# Patient Record
Sex: Female | Born: 1965 | Race: Black or African American | Hispanic: No | Marital: Married | Smoking: Never smoker
Health system: Southern US, Community
[De-identification: ages and names within clinical notes are randomized; demographics above are authoritative.]

## PROBLEM LIST (undated history)

## (undated) DIAGNOSIS — K219 Gastro-esophageal reflux disease without esophagitis: Secondary | ICD-10-CM

## (undated) DIAGNOSIS — F32A Depression, unspecified: Secondary | ICD-10-CM

## (undated) DIAGNOSIS — Z8719 Personal history of other diseases of the digestive system: Secondary | ICD-10-CM

## (undated) DIAGNOSIS — D649 Anemia, unspecified: Secondary | ICD-10-CM

## (undated) DIAGNOSIS — D219 Benign neoplasm of connective and other soft tissue, unspecified: Secondary | ICD-10-CM

## (undated) DIAGNOSIS — M1990 Unspecified osteoarthritis, unspecified site: Secondary | ICD-10-CM

## (undated) DIAGNOSIS — E785 Hyperlipidemia, unspecified: Secondary | ICD-10-CM

## (undated) DIAGNOSIS — G4730 Sleep apnea, unspecified: Secondary | ICD-10-CM

## (undated) DIAGNOSIS — F419 Anxiety disorder, unspecified: Secondary | ICD-10-CM

## (undated) DIAGNOSIS — I10 Essential (primary) hypertension: Secondary | ICD-10-CM

## (undated) HISTORY — DX: Gastro-esophageal reflux disease without esophagitis: K21.9

## (undated) HISTORY — DX: Hyperlipidemia, unspecified: E78.5

## (undated) HISTORY — PX: CHOLECYSTECTOMY: SHX55

## (undated) HISTORY — DX: Benign neoplasm of connective and other soft tissue, unspecified: D21.9

---
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
## 2000-09-02 HISTORY — PX: MYOMECTOMY: SHX85

## 2000-09-02 HISTORY — PX: NISSEN FUNDOPLICATION: SHX2091

## 2001-05-19 ENCOUNTER — Encounter: Payer: Self-pay | Admitting: Gastroenterology

## 2001-05-19 ENCOUNTER — Ambulatory Visit (HOSPITAL_COMMUNITY): Admission: RE | Admit: 2001-05-19 | Discharge: 2001-05-19 | Payer: Self-pay | Admitting: Gastroenterology

## 2001-05-27 ENCOUNTER — Ambulatory Visit (HOSPITAL_COMMUNITY): Admission: RE | Admit: 2001-05-27 | Discharge: 2001-05-27 | Payer: Self-pay | Admitting: Gastroenterology

## 2001-07-24 ENCOUNTER — Encounter (INDEPENDENT_AMBULATORY_CARE_PROVIDER_SITE_OTHER): Payer: Self-pay | Admitting: Specialist

## 2001-07-24 ENCOUNTER — Inpatient Hospital Stay (HOSPITAL_COMMUNITY): Admission: RE | Admit: 2001-07-24 | Discharge: 2001-07-27 | Payer: Self-pay | Admitting: Surgery

## 2001-07-24 ENCOUNTER — Encounter: Payer: Self-pay | Admitting: Surgery

## 2002-03-15 ENCOUNTER — Ambulatory Visit (HOSPITAL_COMMUNITY): Admission: RE | Admit: 2002-03-15 | Discharge: 2002-03-15 | Payer: Self-pay | Admitting: Obstetrics and Gynecology

## 2002-03-15 ENCOUNTER — Encounter: Payer: Self-pay | Admitting: Obstetrics and Gynecology

## 2002-07-02 ENCOUNTER — Inpatient Hospital Stay (HOSPITAL_COMMUNITY): Admission: RE | Admit: 2002-07-02 | Discharge: 2002-07-04 | Payer: Self-pay | Admitting: Obstetrics and Gynecology

## 2002-07-02 ENCOUNTER — Encounter (INDEPENDENT_AMBULATORY_CARE_PROVIDER_SITE_OTHER): Payer: Self-pay | Admitting: Specialist

## 2003-12-20 ENCOUNTER — Ambulatory Visit (HOSPITAL_COMMUNITY): Admission: RE | Admit: 2003-12-20 | Discharge: 2003-12-20 | Payer: Self-pay | Admitting: Obstetrics and Gynecology

## 2005-03-19 ENCOUNTER — Other Ambulatory Visit: Admission: RE | Admit: 2005-03-19 | Discharge: 2005-03-19 | Payer: Self-pay | Admitting: Obstetrics and Gynecology

## 2005-06-27 ENCOUNTER — Encounter (INDEPENDENT_AMBULATORY_CARE_PROVIDER_SITE_OTHER): Payer: Self-pay | Admitting: *Deleted

## 2005-06-27 ENCOUNTER — Inpatient Hospital Stay (HOSPITAL_COMMUNITY): Admission: RE | Admit: 2005-06-27 | Discharge: 2005-07-01 | Payer: Self-pay | Admitting: Obstetrics and Gynecology

## 2005-09-02 HISTORY — PX: BREAST SURGERY: SHX581

## 2005-10-11 ENCOUNTER — Ambulatory Visit (HOSPITAL_BASED_OUTPATIENT_CLINIC_OR_DEPARTMENT_OTHER): Admission: RE | Admit: 2005-10-11 | Discharge: 2005-10-11 | Payer: Self-pay | Admitting: Surgery

## 2005-10-11 ENCOUNTER — Encounter (INDEPENDENT_AMBULATORY_CARE_PROVIDER_SITE_OTHER): Payer: Self-pay | Admitting: Specialist

## 2006-08-12 ENCOUNTER — Encounter (INDEPENDENT_AMBULATORY_CARE_PROVIDER_SITE_OTHER): Payer: Self-pay | Admitting: Specialist

## 2006-08-12 ENCOUNTER — Encounter: Admission: RE | Admit: 2006-08-12 | Discharge: 2006-08-12 | Payer: Self-pay | Admitting: Surgery

## 2007-08-14 ENCOUNTER — Encounter: Admission: RE | Admit: 2007-08-14 | Discharge: 2007-08-14 | Payer: Self-pay | Admitting: Obstetrics and Gynecology

## 2008-08-15 ENCOUNTER — Encounter: Admission: RE | Admit: 2008-08-15 | Discharge: 2008-08-15 | Payer: Self-pay | Admitting: Obstetrics and Gynecology

## 2008-08-19 ENCOUNTER — Ambulatory Visit: Payer: Self-pay | Admitting: Internal Medicine

## 2008-09-08 ENCOUNTER — Ambulatory Visit: Payer: Self-pay | Admitting: Internal Medicine

## 2008-09-08 HISTORY — PX: COLONOSCOPY: SHX174

## 2009-08-16 ENCOUNTER — Encounter: Admission: RE | Admit: 2009-08-16 | Discharge: 2009-08-16 | Payer: Self-pay | Admitting: Obstetrics and Gynecology

## 2010-08-21 ENCOUNTER — Encounter
Admission: RE | Admit: 2010-08-21 | Discharge: 2010-08-21 | Payer: Self-pay | Source: Home / Self Care | Attending: Obstetrics and Gynecology | Admitting: Obstetrics and Gynecology

## 2011-01-18 NOTE — Procedures (Signed)
[place]. [facility]  Patient:    [name], ROEPER Visit Number: [id number] MRN: [id number]          Service Type: END Location: ENDO Attending Physician:  Rich Brave Dictated by:   Florencia Reasons, M.D. Proc. Date: 05/27/01 Admit Date:  05/27/2001   CC:         Redmond Baseman, M.D.  [place] Daphine Deutscher, M.D.  Veverly Fells. Arletha Grippe, M.D.   Procedure Report  PROCEDURE:  Upper endoscopy.  ENDOSCOPIST:  Florencia Reasons, M.D.  INDICATION:  [age]-year-old African-American female with long-standing reflux symptoms, including probably some laryngopharyngeal reflux, whose symptoms have been fairly well-controlled by the use of Nexium, but who is interested in possibly having antireflux surgery so that she can get off medication.  FINDINGS:  Minimal spontaneously reducing hiatal hernia, otherwise, normal.  DESCRIPTION OF PROCEDURE:  The nature, purpose and risks of the procedure had been discussed with the patient, who provided written consent.  Sedation was Fentanyl 50 mcg and Versed 7.5 mg IV, without arrhythmias or desaturation. The Olympus standard adult videoendoscope was passed under direct vision.  The larynx and vocal cords looked normal at this time.  Specifically, I did not see any changes of overt reflux laryngitis, although an optimal controlled look was not obtained.  The esophagus was normal.  There was no evidence of free gastroesophageal reflux nor any reflux esophagitis, Barretts esophagus, varices, infection or neoplasia.  The Z-line was fairly well-defined but I did not see any ring or stricture.  There may have been some slight squamous irregularity right at the Z-line but really nothing I could call acute or chronic reflux-induced changes.  The stomach contained no significant residual and had normal mucosa without evidence of gastritis, erosions, ulcers, polyps or masses and the pylorus, duodenal bulb and  second duodenum looked normal.  Retroflexed viewing of the cardia of the stomach showed a 1- to 2-cm hiatal hernia, which really was not evident on antegrade viewing, suggesting that it was spontaneously reducing when the scope was withdrawn back into the esophagus.  No biopsies were obtained.  The patient tolerated the procedure well and there were no apparent complications.  IMPRESSION:  Minimal intermittent hiatal hernia, otherwise, normal exam.  No evidence of adverse sequela of gastroesophageal reflux disease.  PLAN:  Continue with plan for surgical evaluation.  Note that the patient had a normal upper GI series recently. Dictated by:   Florencia Reasons, M.D. Attending Physician:  Rich Brave DD:  05/27/01 TD:  05/27/01 Job: [id number]

## 2011-01-18 NOTE — H&P (Signed)
[name], [name]            ACCOUNT NO.:  [id number]   MEDICAL RECORD NO.:  [id number]          PATIENT TYPE:  INP   LOCATION:  NA                            FACILITY:  [facility]   PHYSICIAN:  Malachi Pro. Ambrose Mantle, M.D. DATE OF BIRTH:  1965-12-03   DATE OF ADMISSION:  06/27/2005  DATE OF DISCHARGE:                                HISTORY & PHYSICAL   HISTORY OF PRESENT ILLNESS:  A 45 year old, black, married female, para 0,  gravida 1, last menstrual period September 29, 2004, [name] June 17, 2005, by  ultrasound, admitted for C-section because at the time of a myomectomy the  incision either went into the endometrial cavity or came very close to it.  Blood group and type O positive.  Negative antibody.  Negative sickle cell.  Nonreactive serology.  Rubella immune.  Hepatitis B surface antigen  negative.  HIV negative.  GC and Chlamydia negative.  Pap smear normal.  One-  hour Glucola 184, three-hour GTT 75, 138, 114, and 133.  Group B strep was  positive.  The patient had a vaginal ultrasound on November 27, 2004, crown-  rump length was 1.88-cm, 8 weeks 3 days, [name] July 06, 2005.  Repeat  ultrasound, on February 05, 2005, average gestational age [redacted] weeks 4 days,  unchanged EDC.  The patient also had first trimester screening at [name] and at 13 weeks 0 days a due date was given of July 04, 2005.  The prenatal screening changed her risk of trisomy-18 and Down  syndrome to much less than would be accounted for by age alone.  The patient  was noted at 24 weeks to have a polyp or neoplasm at 9 o'clock on the cervix  and a Pap smear was done and it was negative.  A colposcopy was not done.  The patient has had an uncomplicated prenatal course.  Her only major  problem has been hemorrhoids that have been treated with Anusol-HC cream.  The patient is adamant that she wants tubal ligation at the time of the C-  section.   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   SURGERIES:  1.  The patient did have myomectomies in 2003, one of which was an      intramural fibroid that was larger than the uterus itself and either      came very close to the endometrial cavity or went into the endometrial      cavity.  2.  In 1971, she had a right inguinal hernia.  3.  In 1999, a repair of an esophageal hernia.  4.  She has also had a cholecystectomy.  5.  Hemorrhoidectomy.  6.  Tonsillectomy.   ILLNESSES:  The patient had esophageal problems.   There is no obstetric history.   FAMILY HISTORY:  Noncontributory except the nephew of the father of the baby  is mentally slow and the father of the baby has sickle cell trait.   PHYSICAL EXAMINATION:  GENERAL:  Well-developed, well-nourished, black  female in no distress.  VITAL SIGNS:  Blood pressure is 110/70,  pulse is 80, weight 184 pounds.  HEENT:  Normal.  HEART:  Normal sinus rhythm.  No murmurs.  LUNGS:  Clear to auscultation.  ABDOMEN:  Soft.  Fundal height 38-cm.  Fetal heart tones normal.  PELVIC:  Cervix:  Possible polyp or other type of growth at 9 o'clock on the  cervix.  The cervix is closed.  Presenting part is high.   ADMITTING IMPRESSION:  Intrauterine pregnancy at 39 weeks with a history of  myomectomy that was close to if not in the endometrial cavity.   The patient is admitted for C-section.  She wants to have her tubes tied.  She will be asked again prior to doing the procedure but if she wants it  done I will do it.      Malachi Pro. Ambrose Mantle, M.D.  Electronically Signed     TFH/MEDQ  D:  06/26/2005  T:  06/26/2005  Job:  [id number]

## 2011-01-18 NOTE — Procedures (Signed)
[place]. [facility]  Patient:    [name], [name] Visit Number: [id number] MRN: [id number]          Service Type: Attending:  Florencia Reasons, M.D. Dictated by:   Florencia Reasons, M.D. Proc. Date: 05/19/01   CC:         [place]. Daphine Deutscher, M.D.   Procedure Report  PROCEDURE PERFORMED:  Esophageal manometry.  ENDOSCOPIST:  Florencia Reasons, M.D.  INDICATIONS FOR PROCEDURE:  Consideration for antireflux surgery, for preoperative motility assessment.  FINDINGS:  Normal exam.  DESCRIPTION OF PROCEDURE:  The procedure had been discussed with the patient and she provided written consent.  It was done as an outpatient by the endoscopy nurse at [facility].  The solid state catheter was passed transnasally via the left nostril down to the stomach and sequential pullback was then performed.  FINDINGS: 1 - Lower esophageal sphincter.  The lower esophageal sphincter had slightly elevated resting tone of 61 mmHg (normal up to 45) but it relaxed normally with swallows.  2 - Esophageal body.  Esophageal contractions were normal in amplitude and duration and no spontaneous aperistaltic or repetitive activity was noted (in one channel, the amplitude was minimally elevated above the upper limit of normal but this is not felt to be clinically significant).  3 - Upper esophageal sphincter.  The upper esophageal sphincter had normal resting tone and appeared to relax appropriately with pharyngeal contractions.  IMPRESSION:  Essentially normal esophageal manometric study.  Mild nonspecific and insignificant abnormality such as increased resting tone of the lower esophageal sphincter, not felt to be clinically pertinent.  PLAN:  The patient, from the manometric perspective, is a candidate for antireflux surgery, if there is a reasonable degree of certainty that acid reflux is occurring.  If it is, it is probably occurring by  intermittent inappropriate relaxations of the lower esophageal sphincter muscle, since it has normal elevated resting tone. Dictated by:   Florencia Reasons, M.D. Attending:  Florencia Reasons, M.D. DD:  05/20/01 TD:  05/21/01 Job: [id number]

## 2011-01-18 NOTE — Op Note (Signed)
NAMEMarland Kitchen  DANICKA, HOURIHAN                      ACCOUNT NO.:  [id number]   MEDICAL RECORD NO.:  [id number]                   PATIENT TYPE:  INP   LOCATION:  X001                                 FACILITY:  [facility]   PHYSICIAN:  Malachi Pro. Ambrose Mantle, M.D.              DATE OF BIRTH:  1966-07-10   DATE OF PROCEDURE:  07/02/2002  DATE OF DISCHARGE:                                 OPERATIVE REPORT   PREOPERATIVE DIAGNOSES:  1. Leiomyomata uteri.  2. Menorrhagia.  3. Dysmenorrhea.  4. Anemia.   POSTOPERATIVE DIAGNOSES:  1. Leiomyomata uteri.  2. Menorrhagia.  3. Dysmenorrhea.  4. Anemia.   OPERATION:  Removal of large posterior fundal fibroid as well as two  probable fibroids, one located at the juncture of the left round ligament  and uterus and one close to the cornual portion of the fallopian tube on the  uterus. Myomectomies as stated.   SURGEON:  Malachi Pro. Ambrose Mantle, M.D.   ASSISTANT:  Zenaida Niece, M.D.   ANESTHESIA:  General.   DESCRIPTION OF PROCEDURE:  The patient was brought to the operating room and  placed under satisfactory general anesthesia. She was placed in frog leg  position. The abdomen, vulva, vagina and urethra were prepped with Betadine  solution and a Foley catheter was inserted to straight drain. Exam revealed  the uterus to be enlarged with a firm mass in the uterus. The adnexa were  free of masses. The patient was then placed supine, the abdomen was draped  as a sterile field. She did have what appeared to be a right inguinal hernia  scar. A transverse incision was made and carried in layers through the skin,  subcutaneous tissue and fascia. The fascia was separated from the rectus  muscle superiorly and inferiorly. The rectus muscle was then split in the  midline and the peritoneum was opened vertically. I could not examine the  upper abdomen because of the relatively small incision. The lower end of the  Balfour retractor could not be inserted because  of the fairly narrow  incision. I used a pediatric Balfour retractor and elevated the uterus into  the operative field. There was some clear fluid in the abdomen. Inspection  of the pelvis revealed the cul-de-sac to be clear, the uterus was anterior,  two times normal size with what appeared to be a posterior intramural  fibroid that on ultrasound had pushed the superior part of the endometrial  cavity anteriorly. There were also two smaller fibroids, one at the junction  of the left round ligament and the uterus and one just posterior to the  proximal most portion of the left fallopian tube. The anterior cul-de-sac  was normal. Both tubes and ovaries appeared normal although both ovaries had  a very smooth surface. The left ovary was larger than the right, the left  ovary probably measured 3 1/2 x 2 1/2 x 2 1/2 cm, the right  ovary measured  more like the usual 3 x 2 x 2. It could be that the patient has polycystic  ovarian syndrome. I injected the surface of the posterior aspect of the  uterus with a dilute solution of Pitressin. The Pitressin was 10 units and  100 cc and I used about 7 or 8 cc. Going by the ultrasound findings, the  uterine fibroid was in the posterior aspect of the uterus pushing the cavity  anteriorly. I then incised over the mass, got down to the fibroid itself,  grasped with a towel clip and then gradually dissected it free from all its  attachments and removed the fibroid. The fibroid was quite large. By  ultrasound it had measured 6.6 x 5 x 4.8 cm and that seemed to be about the  size that it was. It was probably larger than the uterus itself. The bed of  the fibroid appeared to be possibly consistent with the endometrium but I do  not think we entered the endometrial cavity. I could not follow any space  down toward the cervix. The uterine incision was then closed with two  running sutures of #0 Vicryl and this caused a good reapproximation of the  serosal surface  of the uterus and there was no significant bleeding. I then  removed both of the previously mentioned fibroids and without any suture  controlled bleeding with the Bovie. I liberally irrigated the pelvis, the  uterus and the incision sites, saw no bleeding and closed the abdominal wall  with running suture of #0 Vicryl on the peritoneum, interrupted #0 Vicryl on  the rectus muscle, two running sutures of #0 Vicryl on the fascia, a running  3-0 Vicryl in the subcu tissue and staples on the skin. The patient seemed  to tolerate the procedure well, blood loss was probably about 50 cc, sponge  and needle counts were correct and she was returned to recovery in  satisfactory condition.                                               Malachi Pro. Ambrose Mantle, M.D.    TFH/MEDQ  D:  07/02/2002  T:  07/02/2002  Job:  [id number]

## 2011-01-18 NOTE — H&P (Signed)
NAMELAMANDA, RUDDER                        ACCOUNT NO.:  [id number]   MEDICAL RECORD NO.:  [id number]                   PATIENT TYPE:   LOCATION:                                       FACILITY:   PHYSICIAN:  Malachi Pro. Ambrose Mantle, M.D.              DATE OF BIRTH:   DATE OF ADMISSION:  DATE OF DISCHARGE:                                HISTORY & PHYSICAL   HISTORY OF PRESENT ILLNESS:  The patient is a 45 year old black married  female, para 0, who was admitted to [facility] for  myomectomy because of fibroids, menorrhagia, and dysmenorrhea. This patient  had been on Ortho Tri-Cyclic and her pills were changed to Nordette. When I  saw her on February 26, 2002 she reported that over the last 3-4 months, her  periods had become heavier, longer, and much more painful. The uterus felt  somewhat irregular and quite firm. An ultrasound was done which was  interpreted as showing a 5x4.8x6.6 cm fibroid that displaced the endometrial  stripe anteriorly along its bundle course. The fibroid appeared to be  submucosal, subserosal and myometrial in location. The ovaries appeared  normal. The patient was advised to consider surgery and she agreed. She is  now admitted for attempted myomectomy. She understands that it is possible  that a hysterectomy would be needed. Her last menstrual period was June 05, 2002.   ALLERGIES:  MORPHINE.   PAST SURGICAL HISTORY:  Hiatal hernia repair, gallbladder surgery, and  hemorrhoidectomy. She has also had her tonsils removed.   PAST MEDICAL HISTORY:  Usual childhood diseases. She has no known heart  problems.   SOCIAL HISTORY:  She rarely drinks alcohol and does not smoke.   REVIEW OF SYSTEMS:  Negative.   FAMILY HISTORY:  Her mother is 62 years of age with bronchitis. Father is 54  years of age with high blood pressure. No sisters. One brother is living and  well.   PHYSICAL EXAMINATION:  GENERAL: A well developed, well nourished   black  female in no acute distress.  VITAL SIGNS: Weight 144 pounds. Blood pressure 130/70. Pulse 70.  HEENT: No cranial abnormalities. Extraocular muscles intact. Nose and  pharynx clear.  NECK: Supple. No thyromegaly.  HEART: Normal size and sounds. No murmurs.  LUNGS: Clear to auscultation and percussion.  BREASTS: Soft without masses.  ABDOMEN: Soft. No masses or tenderness. There are gallbladder surgery scars.  GU: Vulva and vagina clean. Cervix clean. The uterus is anterior. Upper  limits of normal size. Irregular in contour. The adnexa is free of masses.   IMPRESSION:  Recent onset menorrhagia and dysmenorrhea. Leiomyomata by  ultrasound.    PLAN:  The patient is admitted for myomectomy. She understands that there  are risks involved including pulmonary embolus, wound disruption,  hemorrhage, the need for re-operation and/or transfusion. She understands  that a hysterectomy could possibly have to be done. She is  ready to proceed.                                               Malachi Pro. Ambrose Mantle, M.D.    TFH/MEDQ  D:  07/01/2002  T:  07/01/2002  Job:  [id number]

## 2011-01-18 NOTE — Discharge Summary (Signed)
   [name], [name]                      ACCOUNT NO.:  [id number]   MEDICAL RECORD NO.:  [id number]                   PATIENT TYPE:  INP   LOCATION:  0477                                 FACILITY:  [facility]   PHYSICIAN:  Zenaida Niece, M.D.             DATE OF BIRTH:  1966-06-26   DATE OF ADMISSION:  07/02/2002  DATE OF DISCHARGE:  07/04/2002                                 DISCHARGE SUMMARY   ADMISSION DIAGNOSES:  1. Menorrhagia.  2. Dysmenorrhea.  3. Leiomyomatous uterus.   DISCHARGE DIAGNOSES:  1. Menorrhagia.  2. Dysmenorrhea.  3. Leiomyomatous uterus.   PROCEDURE:  Abdominal myomectomy on 07/02/02.   HISTORY OF PRESENT ILLNESS:  Please see the chart for the full history and  physical.  Briefly, this is a 45 year old black female, para 0, with recent  onset of menorrhagia and dysmenorrhea and found to have a 5 x 5 x 6 cm  fibroid felt to be the cause for the pain.  Dr. Ambrose Mantle discussed options,  and she wished to proceed with myomectomy.   PAST SURGICAL HISTORY:  1. Hiatal hernia repair.  2. Cholecystectomy.  3. Hemorrhoidectomy.  4. Tonsillectomy.   ALLERGIES:  MORPHINE.   PHYSICAL EXAMINATION:  Significant for an uterus which is anterior, upper  limits of normal size, and slightly irregular with no adnexal masses.   HOSPITAL COURSE:  The patient was admitted on 07/02/02, and underwent an  abdominal myomectomy under general anesthesia.  She had a large posterior  fundal fibroid and two tiny fibroids.  Estimated blood loss was 50 cc.  Postoperatively, the patient was able to rapidly ambulate and tolerate a  regular diet.  Preoperative hemoglobin was 10.6, postoperative hemoglobin  was 9.8.  This then stabilized at 9.6.  On the morning of postoperative day  #2, her incision was healing well, and she was felt to be stable enough for  discharge home.   DIET:  Regular.   ACTIVITY:  No strenuous activity.  Pelvic rest.    FOLLOWUP:  In 10 to 14 days for  an incision check.   DISCHARGE MEDICATIONS:  Percocet p.r.n. pain.                                                Zenaida Niece, M.D.    TDM/MEDQ  D:  07/13/2002  T:  07/13/2002  Job:  [id number]

## 2011-01-18 NOTE — Op Note (Signed)
[name], [name]            ACCOUNT NO.:  [id number]   MEDICAL RECORD NO.:  [id number]          PATIENT TYPE:  INP   LOCATION:  NA                            FACILITY:  [facility]   PHYSICIAN:  Malachi Pro. Ambrose Mantle, M.D. DATE OF BIRTH:  07-Jan-1966   DATE OF PROCEDURE:  06/27/2005  DATE OF DISCHARGE:                                 OPERATIVE REPORT   PREOPERATIVE DIAGNOSES:  1.  Intrauterine pregnancy at 39 weeks.  2.  Prior myomectomy with possible entry into the endometrial cavity.  3.  Sterilization.   POSTOPERATIVE DIAGNOSES:  1.  Intrauterine pregnancy at 39 weeks.  2.  Prior myomectomy with possible entry into the endometrial cavity.  3.  Sterilization.  4.  Omental adhesion to the left upper posterior uterine fundus.   OPERATION:  1.  Low transverse cervical C-section.  2.  Bilateral tubal ligation.  3.  Lysis of adhesions.   OPERATOR:  Dr. Ambrose Mantle   ASSISTANT:  Dr. Jackelyn Knife   ANESTHESIA:  Spinal.   The patient was given a spinal anesthetic by Dr. Harvest Forest, and she was placed  in left lateral tilt position.  The abdomen and urethra were prepped with  Betadine solution.  The Foley catheter was inserted to straight drain.  The  patient was placed supine, the abdomen was draped as a sterile field.  The  old transverse incision was utilized, and an incision was made through that  incision and carried in layers through the skin, subcutaneous tissue, and  fascia.  The fascia was separated from the rectus muscles superiorly and  inferiorly.  The rectus muscle was cut in the midline.  The peritoneum was  opened vertically.  Exploration of the lower uterine segment revealed that  the head was down in the lower uterine segment.  An incision was made in the  lower uterine segment peritoneum and extended transversely, and then this  was pushed inferiorly.  The bladder was retracted away.  An incision was  then made into the myometrium and into the endometrial cavity with my  finger.   I separated the uterine muscles superiorly and inferiorly in a  transverse direction by pulling superiorly and inferiorly.  Clear fluid was  obtained.  There was one loop of nuchal cord.  The infant was delivered  without difficulty.  The cord was clamped. The infant was given to Dr. Rennis Golden, who was in attendance.  It was a 6 pound 14 ounce female with Apgars  of 9 at one and 9 at five minutes.  A segment of cord was preserved in case  a pH was necessary.  Routine cord blood studies were obtained, and then the  placenta was removed.  The inside of the uterus was inspected and found to  be free of debris.  The cervix was dilated with a ring forceps.  Inside of  the uterus did not bleed a great deal nor did the uterine incision, but the  majority of the bleeding came from where I had tried to advance the lower  uterine segment peritoneum.  I finally was able to control this  with several  sutures of 0 Vicryl.  I closed the uterus in 2 layers using running locked  suture of 0 Vicryl in the first layer, nonlocking suture of the same  material on the second layer, and I did reapproximate the peritoneum with a  running suture of 0 Vicryl.  Hemostasis was adequate.  I identified both  tubes and ovaries.  Both ovaries looked normal.  The tubes looked normal.  I  made a window in the mesosalpinx on both the right and the left side, put 2  ties of 0 plain catgut proximally and distally on each tube and excised the  portion of tube in between.  There was no bleeding.  Reinspection of the  uterine incision revealed good hemostasis.  Prior to closing the uterus, I  had identified an omental adhesion to the left upper posterior fundus, and  this was divided between clamps, and the clamps were ligated with 0 Vicryl  ties.  Other than that adhesion, there was no sign that the patient had had  a prior myomectomy.  Two small fibroids, no more than 1 cm in diameter were  recognized on the uterine body.   Hemostasis was adequate.  The peritoneum  and uterine muscle were closed with interrupted sutures of 0 Vicryl, some  including the peritoneum, some of them just the rectus muscle.  The fascia  was closed with 2 running sutures of 0 Vicryl, the subcu tissue with a  running 3-0 Vicryl, and the skin was closed with automatic staples.  The  patient seemed to tolerate the procedure well.  Blood loss was estimated at  1000 mL.  Sponge and needle counts were correct, and she was returned to  recovery in satisfactory condition.      Malachi Pro. Ambrose Mantle, M.D.  Electronically Signed     TFH/MEDQ  D:  06/27/2005  T:  06/27/2005  Job:  [id number]

## 2011-01-18 NOTE — Discharge Summary (Signed)
[name], [name]            ACCOUNT NO.:  [id number]   MEDICAL RECORD NO.:  [id number]          PATIENT TYPE:  INP   LOCATION:  9106                          FACILITY:  [facility]   PHYSICIAN:  Malachi Pro. Ambrose Mantle, M.D. DATE OF BIRTH:  [birth year]   DATE OF ADMISSION:  06/27/2005  DATE OF DISCHARGE:  07/01/2005                                 DISCHARGE SUMMARY   A 45 year old black married female para 0, gravida 1 with [facility] July 06, 2005 by ultrasound admitted for cesarean section because at the time of a  myomectomy the incision either went into the endometrial cavity or came very  close to it.  Blood group and type O+.  Negative antibody.  Negative sickle  cell.  Nonreactive serology.  Rubella immune.  Hepatitis B surface antigen  negative.  HIV negative.  GC and Chlamydia negative.  Pap smear normal.  One-  hour Glucola 184.  Three-hour GTT 75, 138, 114, and 133.  Group B Strep  positive.  The patient was admitted, underwent a low transverse cervical  cesarean section by Dr. Ambrose Mantle.  The only evidence of the prior myomectomy  was an omental adhesion to the left upper posterior fundus.  These adhesions  were lysed.  The cesarean section was done and a tubal ligation was  performed at the patient's and her husband's strong request.  There were two  small fibroids noted neither of which was more than 1 cm in diameter.  Postoperatively the patient did quite well.  She was offered discharge  earlier, but chose to stay until the fourth postoperative day.  She was  voiding well without difficulty, passing flatus, tolerating a regular diet,  ambulating without difficulty.  Staples were removed, strips were applied.  The incision seemed to be healing well.  Initial hemoglobin 12.1, hematocrit  36.6, white count 8500, platelet count 333,000.  Follow-up hemoglobin 10.  Comprehensive metabolic profile was normal except for a glucose of 132,  alkaline phosphatase of 184, and albumin of 2.8.  RPR  was nonreactive.  Urinalysis showed 15 mg percent protein, otherwise was negative.   FINAL DIAGNOSES:  1.  Intrauterine pregnancy at 39 weeks delivered by cesarean section.  2.  History of myomectomy where the incision was either close to or in the      endometrial cavity.  3.  Leiomyomata uteri.  4.  Voluntary sterilization.   OPERATION:  1.  Low transverse cervical cesarean section.  2.  Bilateral tubal ligation.  3.  Lysis of adhesions.   FINAL CONDITION:  Improved.   INSTRUCTIONS:  Regular discharge instruction booklet.  The patient is  advised to return to the office in 10-14 days for follow-up examination.  Percocet 5/325 24 tablets one every four to six hours as needed for pain and  Motrin 600 mg 24 tablets one every six hours as needed for pain are given at  discharge.  24 of each are given, one refill on the ibuprofen.      Malachi Pro. Ambrose Mantle, M.D.  Electronically Signed     TFH/MEDQ  D:  07/01/2005  T:  07/01/2005  Job:  [id number]

## 2011-01-18 NOTE — Op Note (Signed)
[facility]  Patient:    [name], [name] [facility] Visit Number: [id number] MRN: [id number]          Service Type: SUR Location: 3W 0340 01 Attending Physician:  Katha Cabal Dictated by:   Thornton Park Daphine Deutscher, M.D. Proc. Date: 07/24/01 Admit Date:  07/24/2001   CC:         Redmond Baseman, M.D. at [facility]  Florencia Reasons, M.D.   Operative Report  PREOPERATIVE DIAGNOSES: 1. Gastroesophageal reflux disease. 2. Gallstones. 3. Thrombosed hemorrhoid.  POSTOPERATIVE DIAGNOSES: 1. Gastroesophageal reflux disease. 2. Gallstones. 3. Thrombosed hemorrhoid.  PROCEDURE: 1. Laparoscopic Nissen fundoplication over 56 dilator with two suture    closure of the diaphragm. 2. Laparoscopic cholecystectomy with intraoperative cholangiogram. 3. Excision of thrombosed hemorrhoids.  SURGEON:  Thornton Park. Daphine Deutscher, M.D.  ASSISTANT:  Adolph Pollack, M.D.  DESCRIPTION OF PROCEDURE:  [name] is a 45 year old lady who was taken to room #1 on Friday, July 24, 2001 and given general anesthesia. The abdomen was prepped with Betadine and draped sterilely. At a longitudinal incision above the umbilicus and through a pursestring suture, I inserted a Hasson cannula without difficulty. A 5 mm was placed in the upper midline, a 10/11 in the left upper quadrant and two 10/11s on the right side.  First, we worked on the [employer]. I used a harmonica scalpel to open to open up the gastrohepatic window and dissect the right crus. I carried this anteriorly over the distal esophagus and down somewhat onto the left side. I went around and created a retroesophageal window fairly easily to identify both crura.  Next, we began taking down short gastrics. This proved to be a challenge because the top part of her stomach was intimately attached and infused to her spleen. However, we worked diligently and carefully with the  harmonic scalpel and divided all the short gastrics, and did not encounter any bleeding. I wa able to tease this away and free up the top of the cardia.  Next, we grasped the cardia and elevated it from behind and I took down the final attachments of the upper portion of the stomach so that this area was completely mobilized.  A single suture was placed to approximate the crura down. I then reached around and grasped a portion of the stomach and found contiguous portion on both sides by doing a shoeshine maneuver. We then passed 56 lighted bougie and I then proceeded to place a three suture wrap using the Endostitch. All three sutures took bites on the esophagus and these were tied down. The upper two had clips placed on the knots. The bougie was withdrawn. I then tacked one other suture to close the hiatus a little bit more. This was approximated.  Next, we irrigated and no bleeding was seen. We withdrew the 5 mm port liver retractor which we were using and then concentrated on the gallbladder. The gallbladder was grasped and elevated. She had numerous thin filmy adhesions attached all along the fundus and down along the neck. We then dissected free the neck and cystic duct and the cystic artery. I used the harmonic scalpel to assist in this dissection. I put a clip upon the gallbladder and made an incision in the cystic duct, and after multiple angle attempts, I was able to get the Reddick catheter in and a dynamic cholangiogram showed a long cystic duct and good retrograde filling up into the liver with free flow into the  duodenum. The cystic duct was then triple clipped, divided. The cystic artery was divided and the gallbladder was removed without entering it. Once removed, we inspected the gallbladder bed and no bleeding or bile leaks were seen. The gallbladder was brought out through the umbilical port which was then tied down. I then placed a second suture of 0 Vicryl to complete  that closure.  All the ports were injected with 0.5% Marcaine and the abdomen was deflated. The wounds were closed with 4-0 Vicryl with Benzoin and Steri-Strips.  The patient was then placed up in stirrups and a thrombosed hemorrhoid, for which she had seen the people at [facility] last weekend, was identified. It was about the size of a quarter and was hard. This area was prepped with Betadine. I injected it with Marcaine. I excised an ellipse of skin and then removed the underlying thrombosed clots. This will enable this area to return into the rectum. I then massaged some Betadine area up into the area.  The patient was awakened and taken to the recovery room in satisfactory condition. Dictated by:   Thornton Park Daphine Deutscher, M.D. Attending Physician:  Katha Cabal DD:  07/24/01 TD:  07/25/01 Job: [id number]

## 2011-01-18 NOTE — Discharge Summary (Signed)
[facility]  Patient:    [name], [name] [facility] Visit Number: [id number] MRN: [id number]          Service Type: SUR Location: 3W 0340 01 Attending Physician:  Katha Cabal Dictated by:   Thornton Park Daphine Deutscher, M.D. Admit Date:  07/24/2001 Discharge Date: 07/27/2001   CC:         Florencia Reasons, M.D.  Redmond Baseman, M.D.   Discharge Summary  PROCEDURE:  November 22, laparoscopic Nissen over a #56 dilator with crural closure, laparoscopic cholecystectomy, intraoperative cholangiogram, excision of thrombosed hemorrhoid.  HOSPITAL COURSE:  [name] is a 45 year old lady with gastroesophageal reflux disease and gallstones.  On the day of her surgery, she acknowledged that she had been having problems with painful defecation and had a thrombosed hemorrhoid apparent in the holding area.  Therefore, we included in our operative permit permission for excision of the hemorrhoid.  All three were done.  She did well and was ready for discharge on July 27, 2001. Instructions were given, reviewed, and she is followed in the office.  FINAL DIAGNOSIS:  Gastroesophageal reflux disease status post laparoscopic Nissen, chronic cholecystitis and cholesterolosis, and hemorrhoid. Dictated by:   Thornton Park Daphine Deutscher, M.D. Attending Physician:  Katha Cabal DD:  08/09/01 TD:  08/09/01 Job: [id number]

## 2011-01-18 NOTE — Op Note (Signed)
[name], [name]            ACCOUNT NO.:  [id number]   MEDICAL RECORD NO.:  [id number]          PATIENT TYPE:  AMB   LOCATION:  DSC                          FACILITY:  [facility]   PHYSICIAN:  Thornton Park. Daphine Deutscher, MD  DATE OF BIRTH:  09-27-1965   DATE OF PROCEDURE:  10/11/2005  DATE OF DISCHARGE:                                 OPERATIVE REPORT   PREOP DIAGNOSIS:  Left breast mass.   POSTOP DIAGNOSIS:  Left breast mass, probable fibroadenoma.   PROCEDURE:  Left breast biopsy.   SURGEON:  Luretha Murphy, MD   ANESTHESIA:  MAC.   DESCRIPTION OF PROCEDURE:  [name] is a 45 year old black female  who has had a palpable mass in her left breast for some time. This is  rubbery and firm. She was concerned and we felt that it would be best  excised.   She was taken to room eight and given some intravenous sedation. The area  been marked with her assistance and then prepped with chlorhexidine and  draped sterilely. A small curvilinear incision was made over it after  injecting the area with 10 mL lidocaine. I went down through the dermis into  the subcu and elevated some little flaps and went down and took this thing  actually off the chest wall muscle to get a margin around it. The 4-0 Vicryl  was placed anteriorly to the anterior margin and this was so noted on the  path slip. Once removed, bleeding was controlled with electrocautery. The  wound was closed in layers of 4-0 and 5-0 Vicryl with Dermabond on the skin.   The patient will be given Vicodin for pain and will followed up in the  office in approximately 2 weeks.      Thornton Park Daphine Deutscher, MD  Electronically Signed     MBM/MEDQ  D:  10/11/2005  T:  10/11/2005  Job:  [id number]   cc:   Malachi Pro. Ambrose Mantle, M.D.  Fax: [phone]

## 2011-07-19 ENCOUNTER — Other Ambulatory Visit: Payer: Self-pay | Admitting: Obstetrics and Gynecology

## 2011-07-19 DIAGNOSIS — Z1231 Encounter for screening mammogram for malignant neoplasm of breast: Secondary | ICD-10-CM

## 2011-08-23 ENCOUNTER — Ambulatory Visit
Admission: RE | Admit: 2011-08-23 | Discharge: 2011-08-23 | Disposition: A | Discharge status: Home / Self Care | Payer: 59 | Source: Ambulatory Visit | Attending: Obstetrics and Gynecology | Admitting: Obstetrics and Gynecology

## 2011-08-23 DIAGNOSIS — Z1231 Encounter for screening mammogram for malignant neoplasm of breast: Secondary | ICD-10-CM

## 2011-10-01 ENCOUNTER — Other Ambulatory Visit: Payer: Self-pay | Admitting: Obstetrics and Gynecology

## 2011-10-04 ENCOUNTER — Encounter (HOSPITAL_COMMUNITY): Payer: Self-pay | Admitting: Pharmacist

## 2011-10-08 ENCOUNTER — Other Ambulatory Visit: Payer: Self-pay | Admitting: Obstetrics and Gynecology

## 2011-10-08 ENCOUNTER — Other Ambulatory Visit (HOSPITAL_COMMUNITY): Payer: 59

## 2011-10-09 ENCOUNTER — Other Ambulatory Visit: Payer: Self-pay

## 2011-10-09 ENCOUNTER — Encounter (HOSPITAL_COMMUNITY): Payer: Self-pay

## 2011-10-09 ENCOUNTER — Encounter (HOSPITAL_COMMUNITY)
Admission: RE | Admit: 2011-10-09 | Discharge: 2011-10-09 | Disposition: A | Discharge status: Home / Self Care | Payer: 59 | Source: Ambulatory Visit | Attending: Obstetrics and Gynecology | Admitting: Obstetrics and Gynecology

## 2011-10-09 HISTORY — DX: Essential (primary) hypertension: I10

## 2011-10-09 LAB — CBC
HCT: 38 % (ref 36.0–46.0)
Hemoglobin: 12.3 g/dL (ref 12.0–15.0)
WBC: 6.3 10*3/uL (ref 4.0–10.5)

## 2011-10-09 LAB — URINALYSIS, ROUTINE W REFLEX MICROSCOPIC
Glucose, UA: NEGATIVE mg/dL
Ketones, ur: NEGATIVE mg/dL
Leukocytes, UA: NEGATIVE
pH: 6 (ref 5.0–8.0)

## 2011-10-09 LAB — URINE MICROSCOPIC-ADD ON

## 2011-10-09 LAB — COMPREHENSIVE METABOLIC PANEL
AST: 13 U/L (ref 0–37)
Albumin: 4.1 g/dL (ref 3.5–5.2)
Alkaline Phosphatase: 114 U/L (ref 39–117)
BUN: 8 mg/dL (ref 6–23)
Potassium: 3.6 mEq/L (ref 3.5–5.1)
Total Protein: 7.7 g/dL (ref 6.0–8.3)

## 2011-10-09 LAB — DIFFERENTIAL
Lymphocytes Relative: 47 % — ABNORMAL HIGH (ref 12–46)
Monocytes Absolute: 0.4 10*3/uL (ref 0.1–1.0)
Monocytes Relative: 7 % (ref 3–12)
Neutro Abs: 2.7 10*3/uL (ref 1.7–7.7)

## 2011-10-09 NOTE — Patient Instructions (Addendum)
[name]  10/09/2011   Your procedure is scheduled on:  10/17/11  Enter through the Main Entrance of [facility] at 8 AM.  Pick up the phone at the desk and dial [phone].   Call this number if you have problems the morning of surgery: [phone]   Remember:   Do not eat food:After Midnight.  Do not drink clear liquids: After Midnight.  Take these medicines the morning of surgery with A SIP OF WATER: Blood pressure medication   Do not wear jewelry, make-up or nail polish.  Do not wear lotions, powders, or perfumes. You may wear deodorant.  Do not shave 48 hours prior to surgery.  Do not bring valuables to the hospital.  Contacts, dentures or bridgework may not be worn into surgery.  Leave suitcase in the car. After surgery it may be brought to your room.  For patients admitted to the hospital, checkout time is 11:00 AM the day of discharge.   Patients discharged the day of surgery will not be allowed to drive home.  Name and phone number of your driver: NA  Special Instructions: CHG Shower Use Special Wash: 1/2 bottle night before surgery and 1/2 bottle morning of surgery.   Please read over the following fact sheets that you were given: MRSA Information

## 2011-10-09 NOTE — Pre-Procedure Instructions (Signed)
EKG approved by Jane Canary, MD  10/09/11  Patton Salles RN

## 2011-10-16 MED ORDER — CEFAZOLIN SODIUM-DEXTROSE 2-3 GM-% IV SOLR
2.0000 g | INTRAVENOUS | Status: AC
  Administered 2011-10-17: 2 g via INTRAVENOUS
  Filled 2011-10-16: qty 50

## 2011-10-16 NOTE — Pre-Procedure Instructions (Signed)
10/16/11 Order received via phone from Dr. Ambrose Mantle to change surgical consent to: TAH. Poss. BSO, Poss ablation of endometriosis and to notify OR desk of change. Info.read back to Dr. Ambrose Mantle to verify, OR desk notified Eunice Blase New).  [place], R.N.

## 2011-10-16 NOTE — H&P (Signed)
NAME:  [name], [name]                 ACCOUNT NO.:  MEDICAL RECORD NO.:  [id number]  LOCATION:                                 FACILITY:  PHYSICIAN:  Malachi Pro. Ambrose Mantle, M.D. DATE OF BIRTH:  [birth year]  DATE OF ADMISSION:  10/17/2011 DATE OF DISCHARGE:                             HISTORY & PHYSICAL   HISTORY OF PRESENT ILLNESS:  This is a 47 year old black married female, para 1-0-0-1, who is admitted to the hospital for abdominal hysterectomy, possible bilateral salpingo-oophorectomy, possible ablation of pelvic endometriosis because of severe menorrhagia, dysmenorrhea, and dyspareunia.  The patient's last menstrual period was October 07, 2011.  Her periods have been at irregular intervals, lasting 6-7 days.  She did have 2 evaluations in the past 3 or so months because of a cervical lesion that turned out to be multiple cyst on cervical biopsy and abnormal uterine bleeding, and endometrial biopsy showed benign proliferative endometrium without hyperplasia or malignancy.  The patient states that with her periods unless she takes ibuprofen, she has pain of 9/10.  She also has pain with intercourse.  She describes it as the penis hits different areas that are very painful.  She uses tampons and pads with her periods and she describes her flow as quite heavy.  She has a past history of having myomectomy, C-section, and tubal ligation.  I have never felt pelvic endometriosis prior to her preoperative visit.  However, on an ultrasound done on September 19, 2011, it was felt that she had multiple small fibroids and adenomyosis with a possible endometrial polyp.  The biopsy was done showed benign proliferative endometrium.  She is admitted now to remove the uterus and possibly the tubes and ovaries if we find extrauterine endometriosis.  Her past medical history reveals allergies to [facility].  No drug allergies.  OPERATIONS:  In 02/25/1973, she had a right inguinal hernia repair.  In  Feb 25, 1998, she had what sounds like a hiatus hernia repair.  In February 25, 2002, she had myomectomy.  In 2005-02-25, she had C-section and bilateral tubal ligation.  MEDICATIONS: 1. Lisinopril 10 mg a day. 2. Atorvastatin 10 mg a day. 3. She also takes biotin.  FAMILY HISTORY:  Father had colon cancer.  Mother with bone cancer. Mother died in 02/25/2009 and father died in Feb 25, 2010.  The patient does not smoke, drink, or take drugs, but she does drink alcohol occasionally. She works for the [employer].  PHYSICAL EXAMINATION:  GENERAL:  Well-developed, well-nourished black female in no distress. VITAL SIGNS:  Weight is 190 pounds, blood pressure 136/86 at her last visit, pulse is 80. HEAD, EYES, NOSE, AND THROAT:  Normal. NECK:  Supple without thyromegaly. HEART:  Normal size and sounds.  No murmurs. LUNGS:  Clear to auscultation. BREASTS:  Soft without masses at her exam in January. ABDOMEN:  Soft.  It is slightly tender to direct palpation.  There are multiple scars. PELVIC:  Vulva and vagina are clean.  The cervix is clean.  The uterus is anterior, slightly enlarged, very tender to motion.  The adnexa are free.  It feels like there is a nodule in the right cul-de-sac.  RECTAL: Exam in January showed no  nodularity.  ADMITTING IMPRESSION:  Leiomyomata uteri, adenomyosis, dysmenorrhea, dyspareunia, menorrhagia.  The patient is admitted for abdominal hysterectomy.  We may remove the ovaries or ablate endometriosis.  I have explained to the patient that if the endometriosis is present and appears to be closely involved with the rectum, I will not try to destroy it, but she has given me permission to remove her ovaries to try to get it to progress.  If there is no extra uterine endometriosis or other disease, then I do not plan to remove her ovaries.  She has been counseled about the risks of surgery including but not limited to thrombophlebitis and pulmonary embolism, wound disruption, hemorrhage with  the need for reoperation and/or transfusion, fistula formation.  She has also been counseled that if the surgery causes a marked decrease in her sexual drive, there is no medication known that will improve her sex drive.  She understands and agrees to proceed.     Malachi Pro. Ambrose Mantle, M.D.     TFH/MEDQ  D:  10/16/2011  T:  10/16/2011  Job:  [id number]

## 2011-10-17 ENCOUNTER — Encounter (HOSPITAL_COMMUNITY)
Admission: RE | Disposition: A | Discharge status: Home / Self Care | Payer: Self-pay | Source: Ambulatory Visit | Attending: Obstetrics and Gynecology

## 2011-10-17 ENCOUNTER — Inpatient Hospital Stay (HOSPITAL_COMMUNITY): Payer: 59 | Admitting: Anesthesiology

## 2011-10-17 ENCOUNTER — Other Ambulatory Visit: Payer: Self-pay | Admitting: Obstetrics and Gynecology

## 2011-10-17 ENCOUNTER — Ambulatory Visit (HOSPITAL_COMMUNITY)
Admission: RE | Admit: 2011-10-17 | Discharge: 2011-10-19 | Disposition: A | Discharge status: Home / Self Care | Payer: 59 | Source: Ambulatory Visit | Attending: Obstetrics and Gynecology | Admitting: Obstetrics and Gynecology

## 2011-10-17 ENCOUNTER — Encounter (HOSPITAL_COMMUNITY): Payer: Self-pay | Admitting: *Deleted

## 2011-10-17 ENCOUNTER — Encounter (HOSPITAL_COMMUNITY): Payer: Self-pay | Admitting: Anesthesiology

## 2011-10-17 DIAGNOSIS — Z01810 Encounter for preprocedural cardiovascular examination: Secondary | ICD-10-CM | POA: Insufficient documentation

## 2011-10-17 DIAGNOSIS — D259 Leiomyoma of uterus, unspecified: Secondary | ICD-10-CM | POA: Insufficient documentation

## 2011-10-17 DIAGNOSIS — IMO0002 Reserved for concepts with insufficient information to code with codable children: Secondary | ICD-10-CM | POA: Insufficient documentation

## 2011-10-17 DIAGNOSIS — N920 Excessive and frequent menstruation with regular cycle: Secondary | ICD-10-CM | POA: Insufficient documentation

## 2011-10-17 DIAGNOSIS — N8000 Endometriosis of the uterus, unspecified: Secondary | ICD-10-CM | POA: Insufficient documentation

## 2011-10-17 DIAGNOSIS — Z01812 Encounter for preprocedural laboratory examination: Secondary | ICD-10-CM | POA: Insufficient documentation

## 2011-10-17 DIAGNOSIS — N946 Dysmenorrhea, unspecified: Secondary | ICD-10-CM | POA: Insufficient documentation

## 2011-10-17 HISTORY — PX: SALPINGOOPHORECTOMY: SHX82

## 2011-10-17 HISTORY — PX: ABDOMINAL HYSTERECTOMY: SHX81

## 2011-10-17 SURGERY — HYSTERECTOMY, ABDOMINAL
Anesthesia: General

## 2011-10-17 MED ORDER — FENTANYL CITRATE 0.05 MG/ML IJ SOLN
INTRAMUSCULAR | Status: AC
  Filled 2011-10-17: qty 5

## 2011-10-17 MED ORDER — ONDANSETRON HCL 4 MG/2ML IJ SOLN: 4.0000 mg | Freq: Four times a day (QID) | INTRAMUSCULAR | Status: DC | PRN

## 2011-10-17 MED ORDER — NALOXONE HCL 0.4 MG/ML IJ SOLN: 0.4000 mg | INTRAMUSCULAR | Status: DC | PRN

## 2011-10-17 MED ORDER — LIDOCAINE HCL (CARDIAC) 20 MG/ML IV SOLN
INTRAVENOUS | Status: DC | PRN
  Administered 2011-10-17: 80 mg via INTRAVENOUS

## 2011-10-17 MED ORDER — ROCURONIUM BROMIDE 50 MG/5ML IV SOLN
INTRAVENOUS | Status: AC
  Filled 2011-10-17: qty 1

## 2011-10-17 MED ORDER — HYDROMORPHONE HCL PF 1 MG/ML IJ SOLN
INTRAMUSCULAR | Status: AC
  Filled 2011-10-17: qty 1

## 2011-10-17 MED ORDER — SODIUM CHLORIDE 0.9 % IJ SOLN: 9.0000 mL | INTRAMUSCULAR | Status: DC | PRN

## 2011-10-17 MED ORDER — HEPARIN SODIUM (PORCINE) 5000 UNIT/ML IJ SOLN
INTRAMUSCULAR | Status: AC
  Administered 2011-10-17: 5000 [IU] via SUBCUTANEOUS
  Filled 2011-10-17: qty 1

## 2011-10-17 MED ORDER — OXYCODONE-ACETAMINOPHEN 5-325 MG PO TABS
1.0000 | ORAL_TABLET | Freq: Four times a day (QID) | ORAL | Status: DC | PRN
  Administered 2011-10-18 (×2): 2 via ORAL
  Administered 2011-10-18 – 2011-10-19 (×3): 1 via ORAL
  Filled 2011-10-17 (×2): qty 2
  Filled 2011-10-17 (×3): qty 1

## 2011-10-17 MED ORDER — ONDANSETRON HCL 4 MG/2ML IJ SOLN
INTRAMUSCULAR | Status: AC
  Filled 2011-10-17: qty 2

## 2011-10-17 MED ORDER — CEFAZOLIN SODIUM 1-5 GM-% IV SOLN
1.0000 g | Freq: Three times a day (TID) | INTRAVENOUS | Status: AC
  Administered 2011-10-17 – 2011-10-18 (×2): 1 g via INTRAVENOUS
  Filled 2011-10-17 (×2): qty 50

## 2011-10-17 MED ORDER — HEPARIN SODIUM (PORCINE) 5000 UNIT/ML IJ SOLN
5000.0000 [IU] | INTRAMUSCULAR | Status: AC
  Administered 2011-10-17: 5000 [IU] via SUBCUTANEOUS

## 2011-10-17 MED ORDER — HYDROMORPHONE HCL PF 1 MG/ML IJ SOLN
0.2500 mg | INTRAMUSCULAR | Status: DC | PRN
  Administered 2011-10-17 (×3): 0.5 mg via INTRAVENOUS

## 2011-10-17 MED ORDER — LACTATED RINGERS IV SOLN
INTRAVENOUS | Status: DC
Start: 2011-10-17 — End: 2011-10-19
  Administered 2011-10-17 – 2011-10-19 (×5): via INTRAVENOUS

## 2011-10-17 MED ORDER — METHYLENE BLUE 1 % INJ SOLN
INTRAMUSCULAR | Status: AC
  Filled 2011-10-17: qty 1

## 2011-10-17 MED ORDER — LISINOPRIL 10 MG PO TABS
10.0000 mg | ORAL_TABLET | Freq: Every day | ORAL | Status: DC
  Administered 2011-10-18: 10 mg via ORAL
  Filled 2011-10-17 (×2): qty 1

## 2011-10-17 MED ORDER — PROPOFOL 10 MG/ML IV EMUL
INTRAVENOUS | Status: DC | PRN
  Administered 2011-10-17: 200 mg via INTRAVENOUS

## 2011-10-17 MED ORDER — KETOROLAC TROMETHAMINE 30 MG/ML IJ SOLN: 15.0000 mg | Freq: Once | INTRAMUSCULAR | Status: DC | PRN

## 2011-10-17 MED ORDER — ONDANSETRON HCL 4 MG/2ML IJ SOLN
INTRAMUSCULAR | Status: DC | PRN
  Administered 2011-10-17: 4 mg via INTRAVENOUS

## 2011-10-17 MED ORDER — NEOSTIGMINE METHYLSULFATE 1 MG/ML IJ SOLN
INTRAMUSCULAR | Status: AC
  Filled 2011-10-17: qty 10

## 2011-10-17 MED ORDER — DIPHENHYDRAMINE HCL 12.5 MG/5ML PO ELIX
12.5000 mg | ORAL_SOLUTION | Freq: Four times a day (QID) | ORAL | Status: DC | PRN
  Administered 2011-10-18: 12.5 mg via ORAL
  Filled 2011-10-17 (×2): qty 5

## 2011-10-17 MED ORDER — DIPHENHYDRAMINE HCL 12.5 MG/5ML PO ELIX
12.5000 mg | ORAL_SOLUTION | Freq: Four times a day (QID) | ORAL | Status: DC | PRN
  Filled 2011-10-17: qty 5

## 2011-10-17 MED ORDER — PROMETHAZINE HCL 25 MG/ML IJ SOLN: 6.2500 mg | INTRAMUSCULAR | Status: DC | PRN

## 2011-10-17 MED ORDER — MORPHINE SULFATE (PF) 1 MG/ML IV SOLN
INTRAVENOUS | Status: DC
  Administered 2011-10-17: 14:00:00 via INTRAVENOUS
  Administered 2011-10-17: 9 mL via INTRAVENOUS
  Administered 2011-10-17: 14 mg via INTRAVENOUS
  Administered 2011-10-18 (×3): 3 mg via INTRAVENOUS
  Filled 2011-10-17: qty 25

## 2011-10-17 MED ORDER — MIDAZOLAM HCL 2 MG/2ML IJ SOLN
INTRAMUSCULAR | Status: AC
  Filled 2011-10-17: qty 2

## 2011-10-17 MED ORDER — ROCURONIUM BROMIDE 100 MG/10ML IV SOLN
INTRAVENOUS | Status: DC | PRN
  Administered 2011-10-17 (×2): 20 mg via INTRAVENOUS
  Administered 2011-10-17: 30 mg via INTRAVENOUS

## 2011-10-17 MED ORDER — PROPOFOL 10 MG/ML IV EMUL
INTRAVENOUS | Status: AC
  Filled 2011-10-17: qty 20

## 2011-10-17 MED ORDER — GLYCOPYRROLATE 0.2 MG/ML IJ SOLN
INTRAMUSCULAR | Status: DC | PRN
  Administered 2011-10-17: .8 mg via INTRAVENOUS

## 2011-10-17 MED ORDER — HEPARIN SODIUM (PORCINE) 5000 UNIT/ML IJ SOLN
5000.0000 [IU] | Freq: Three times a day (TID) | INTRAMUSCULAR | Status: DC
  Administered 2011-10-17 – 2011-10-18 (×5): 5000 [IU] via SUBCUTANEOUS
  Filled 2011-10-17 (×6): qty 1

## 2011-10-17 MED ORDER — DIPHENHYDRAMINE HCL 50 MG/ML IJ SOLN: 12.5000 mg | Freq: Four times a day (QID) | INTRAMUSCULAR | Status: DC | PRN

## 2011-10-17 MED ORDER — ONDANSETRON HCL 4 MG PO TABS: 4.0000 mg | ORAL_TABLET | Freq: Four times a day (QID) | ORAL | Status: DC | PRN

## 2011-10-17 MED ORDER — FENTANYL CITRATE 0.05 MG/ML IJ SOLN
INTRAMUSCULAR | Status: DC | PRN
  Administered 2011-10-17 (×2): 50 ug via INTRAVENOUS
  Administered 2011-10-17: 100 ug via INTRAVENOUS
  Administered 2011-10-17: 150 ug via INTRAVENOUS
  Administered 2011-10-17: 100 ug via INTRAVENOUS

## 2011-10-17 MED ORDER — MEPERIDINE HCL 25 MG/ML IJ SOLN: 6.2500 mg | INTRAMUSCULAR | Status: DC | PRN

## 2011-10-17 MED ORDER — NEOSTIGMINE METHYLSULFATE 1 MG/ML IJ SOLN
INTRAMUSCULAR | Status: DC | PRN
  Administered 2011-10-17: 2 mg via INTRAVENOUS

## 2011-10-17 MED ORDER — SIMVASTATIN 40 MG PO TABS
40.0000 mg | ORAL_TABLET | Freq: Every day | ORAL | Status: DC
  Administered 2011-10-17 – 2011-10-18 (×2): 40 mg via ORAL
  Filled 2011-10-17 (×3): qty 1

## 2011-10-17 MED ORDER — DIPHENHYDRAMINE HCL 50 MG/ML IJ SOLN
12.5000 mg | Freq: Four times a day (QID) | INTRAMUSCULAR | Status: DC | PRN
  Administered 2011-10-18: 12.5 mg via INTRAVENOUS
  Filled 2011-10-17: qty 1

## 2011-10-17 MED ORDER — MIDAZOLAM HCL 5 MG/5ML IJ SOLN
INTRAMUSCULAR | Status: DC | PRN
  Administered 2011-10-17: 2 mg via INTRAVENOUS

## 2011-10-17 MED ORDER — LIDOCAINE HCL (CARDIAC) 20 MG/ML IV SOLN
INTRAVENOUS | Status: AC
  Filled 2011-10-17: qty 5

## 2011-10-17 MED ORDER — LACTATED RINGERS IV SOLN
INTRAVENOUS | Status: DC
  Administered 2011-10-17: 125 mL/h via INTRAVENOUS
  Administered 2011-10-17 (×3): via INTRAVENOUS

## 2011-10-17 MED ORDER — GLYCOPYRROLATE 0.2 MG/ML IJ SOLN
INTRAMUSCULAR | Status: AC
  Filled 2011-10-17: qty 1

## 2011-10-17 SURGICAL SUPPLY — 35 items
CANISTER SUCTION 2500CC (MISCELLANEOUS) ×3 IMPLANT
CLOTH BEACON ORANGE TIMEOUT ST (SAFETY) ×3 IMPLANT
CONT PATH 16OZ SNAP LID 3702 (MISCELLANEOUS) ×3 IMPLANT
DECANTER SPIKE VIAL GLASS SM (MISCELLANEOUS) IMPLANT
DRSG PAD ABDOMINAL 8X10 ST (GAUZE/BANDAGES/DRESSINGS) ×1 IMPLANT
DRSG VASELINE 3X18 (GAUZE/BANDAGES/DRESSINGS) ×3 IMPLANT
GAUZE PETROLATUM 7835 (GAUZE/BANDAGES/DRESSINGS) ×1 IMPLANT
GAUZE SPONGE 4X4 12PLY STRL LF (GAUZE/BANDAGES/DRESSINGS) ×3 IMPLANT
GLOVE BIO SURGEON STRL SZ7.5 (GLOVE) ×6 IMPLANT
GLOVE BIOGEL M 6.5 STRL (GLOVE) ×4 IMPLANT
GLOVE INDICATOR 6.5 STRL GRN (GLOVE) ×1 IMPLANT
GLOVE INDICATOR 7.0 STRL GRN (GLOVE) ×1 IMPLANT
GOWN PREVENTION PLUS LG XLONG (DISPOSABLE) ×6 IMPLANT
GOWN PREVENTION PLUS XLARGE (GOWN DISPOSABLE) ×3 IMPLANT
NS IRRIG 1000ML POUR BTL (IV SOLUTION) ×3 IMPLANT
PACK ABDOMINAL GYN (CUSTOM PROCEDURE TRAY) ×3 IMPLANT
PAD OB MATERNITY 4.3X12.25 (PERSONAL CARE ITEMS) ×3 IMPLANT
PROTECTOR NERVE ULNAR (MISCELLANEOUS) ×3 IMPLANT
RETRACTOR WND ALEXIS 25 LRG (MISCELLANEOUS) IMPLANT
RTRCTR WOUND ALEXIS 25CM LRG (MISCELLANEOUS) ×3
SPONGE LAP 18X18 X RAY DECT (DISPOSABLE) ×6 IMPLANT
SPONGE LAP 4X18 X RAY DECT (DISPOSABLE) ×3 IMPLANT
STAPLER VISISTAT 35W (STAPLE) ×3 IMPLANT
SUT PDS AB 0 CTX 60 (SUTURE) IMPLANT
SUT VIC AB 0 CT1 18XCR BRD8 (SUTURE) ×6 IMPLANT
SUT VIC AB 0 CT1 27 (SUTURE) ×18
SUT VIC AB 0 CT1 27XBRD ANBCTR (SUTURE) ×6 IMPLANT
SUT VIC AB 0 CT1 8-18 (SUTURE) ×9
SUT VIC AB 3-0 CTX 36 (SUTURE) ×3 IMPLANT
SUT VIC AB 3-0 SH 27 (SUTURE)
SUT VIC AB 3-0 SH 27X BRD (SUTURE) IMPLANT
SUT VICRYL 0 TIES 12 18 (SUTURE) ×3 IMPLANT
TOWEL OR 17X24 6PK STRL BLUE (TOWEL DISPOSABLE) ×6 IMPLANT
TRAY FOLEY CATH 14FR (SET/KITS/TRAYS/PACK) ×3 IMPLANT
WATER STERILE IRR 1000ML POUR (IV SOLUTION) ×3 IMPLANT

## 2011-10-17 NOTE — Anesthesia Procedure Notes (Signed)
Procedure Name: Intubation Date/Time: 10/17/2011 9:34 AM Performed by: Carlyle Lipa Pre-anesthesia Checklist: Patient identified, Emergency Drugs available, Suction available, Timeout performed and Patient being monitored Patient Re-evaluated:Patient Re-evaluated prior to inductionOxygen Delivery Method: Circle System Utilized Preoxygenation: Pre-oxygenation with 100% oxygen Intubation Type: IV induction Ventilation: Mask ventilation without difficulty Laryngoscope Size: Miller and 2 Grade View: Grade I Tube type: Oral Tube size: 7.0 mm Number of attempts: 1 Airway Equipment and Method: stylet Placement Confirmation: ETT inserted through vocal cords under direct vision,  positive ETCO2 and breath sounds checked- equal and bilateral (oral ng placed) Secured at: 21 cm Tube secured with: Tape Dental Injury: Teeth and Oropharynx as per pre-operative assessment

## 2011-10-17 NOTE — Anesthesia Postprocedure Evaluation (Signed)
Anesthesia Post Note  Patient: [name]  Procedure(s) Performed: Procedure(s) (LRB): HYSTERECTOMY ABDOMINAL (N/A) SALPINGO OOPHERECTOMY (Bilateral)  Anesthesia type: General  Patient location: PACU  Post pain: Pain level controlled  Post assessment: Post-op Vital signs reviewed  Last Vitals:  Filed Vitals:   10/17/11 1215  BP: 166/88  Pulse: 92  Temp:   Resp: 18    Post vital signs: Reviewed  Level of consciousness: sedated  Complications: No apparent anesthesia complicationsfj

## 2011-10-17 NOTE — Transfer of Care (Signed)
Immediate Anesthesia Transfer of Care Note  Patient: [name]  Procedure(s) Performed: Procedure(s) (LRB): HYSTERECTOMY ABDOMINAL (N/A) SALPINGO OOPHERECTOMY (Bilateral)  Patient Location: PACU  Anesthesia Type: General  Level of Consciousness: awake, alert  and oriented  Airway & Oxygen Therapy: Patient Spontanous Breathing and Patient connected to nasal cannula oxygen  Post-op Assessment: Report given to PACU RN  Post vital signs: Reviewed  Complications: No apparent anesthesia complications

## 2011-10-17 NOTE — Progress Notes (Signed)
Patient ID: [name], female   DOB: Sep 08, 1965, 46 y.o.   MRN: [id number] Pt was examined 10-16-11 and reports no change in her health since that time.

## 2011-10-17 NOTE — Anesthesia Preprocedure Evaluation (Signed)
Anesthesia Evaluation  Patient identified by MRN, date of birth, ID band Patient awake    Reviewed: Allergy & Precautions, H&P , NPO status , Patient's Chart, lab work & pertinent test results  Airway Mallampati: I TM Distance: >3 FB Neck ROM: full    Dental No notable dental hx. (+) Teeth Intact   Pulmonary neg pulmonary ROS,    Pulmonary exam normal       Cardiovascular hypertension, Pt. on medications neg cardio ROS     Neuro/Psych Negative Neurological ROS  Negative Psych ROS   GI/Hepatic negative GI ROS, Neg liver ROS,   Endo/Other  Negative Endocrine ROS  Renal/GU negative Renal ROS  Genitourinary negative   Musculoskeletal negative musculoskeletal ROS (+)   Abdominal Normal abdominal exam  (+)   Peds negative pediatric ROS (+)  Hematology negative hematology ROS (+)   Anesthesia Other Findings   Reproductive/Obstetrics negative OB ROS                           Anesthesia Physical Anesthesia Plan  ASA: II  Anesthesia Plan: General   Post-op Pain Management:    Induction: Intravenous  Airway Management Planned: Oral ETT  Additional Equipment:   Intra-op Plan:   Post-operative Plan: Extubation in OR  Informed Consent: I have reviewed the patients History and Physical, chart, labs and discussed the procedure including the risks, benefits and alternatives for the proposed anesthesia with the patient or authorized representative who has indicated his/her understanding and acceptance.   Dental Advisory Given  Plan Discussed with: CRNA  Anesthesia Plan Comments:         Anesthesia Quick Evaluation

## 2011-10-18 LAB — CBC
HCT: 35.1 % — ABNORMAL LOW (ref 36.0–46.0)
MCH: 28.5 pg (ref 26.0–34.0)
MCHC: 32.2 g/dL (ref 30.0–36.0)
MCV: 88.4 fL (ref 78.0–100.0)
RDW: 13.3 % (ref 11.5–15.5)

## 2011-10-18 NOTE — Addendum Note (Signed)
Addendum  created 10/18/11 0747 by Doreene Burke, CRNA   Modules edited:Notes Section

## 2011-10-18 NOTE — Op Note (Signed)
[name], [name]            ACCOUNT NO.:  [id number]  MEDICAL RECORD NO.:  [id number]  LOCATION:  9320                          FACILITY:  [facility]  PHYSICIAN:  Malachi Pro. Ambrose Mantle, M.D. 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  OPERATION:  Abdominal hysterectomy, bilateral salpingo-oophorectomy.  OPERATOR:  Malachi Pro. Ambrose Mantle, MD  ASSISTANT:  Sherron Monday, MD  ANESTHESIA:  General anesthesia.  PROCEDURE:  The patient was brought to the operating room, placed under satisfactory general anesthesia, placed supine on the table in frog-leg position.  The abdomen and urethra were prepped not with Betadine solution, but a clear antiseptic solution.  A Foley catheter was inserted to straight drain.  Exam revealed the uterus to be upper limit of normal size anterior.  There was a large nodule close to the right uterosacral ligament.  Otherwise, the adnexa were free of masses.  The patient was placed supine.  A time-out was done.  The abdomen was draped as a sterile field.  The old incision was utilized to make an incision through the skin, subcutaneous tissue and down to the fascia.  The fascia was separated from the rectus muscles superiorly and inferiorly, but there was scarring between the fascia and the rectus muscle causing some dissection to be necessary.  The peritoneal cavity was entered with my finger and then I enlarged the peritoneal incision by dividing the rectus muscles superiorly and inferiorly in the midline.  The incision was quite low, so I could not examine the upper abdomen.  A  Balfour retractor was placed and a bladder blade was inserted.  Two 18 x 18 packs were used to prepare the operative field.  I put two clamps across the upper pedicles and then pulled the uterus inferiorly to try to visualize the nodule that had been felt in the pelvis.  This nodule was close to the sigmoid colon probably part of appendiceal epiploicae and actually had dark pigment indicative of endometriosis.  This nodule was about 3 x 2 cm.  I discussed this with the patient preoperatively and told her that if I did confirm that this was endometriosis, I would remove her ovaries and she had agreed.  The round ligaments bilaterally were divided with the Bovie.  The utero-ovarian ligaments were clamped, cut, and suture ligated bilaterally preserving the ovaries until I had a better look at this nodule in the pelvis.  I went down the sides of the uterus, advanced the bladder, clamping, cutting and suture ligating as I went with 0 Vicryl.  After the uterosacral ligaments were clamped, cut, and suture ligated and held, the vaginal angles were entered.  The angles were suture ligated and the uterus was removed by transecting the upper vagina and the vagina was closed with interrupted figure-of-eight sutures of 0 Vicryl.  At this point, we got a better look at the nodule that was present and I do think it  is going to be wise for her to have a sigmoidoscopy or colonoscopy later, but this nodule almost certainly is endometriosis and I told the patient preoperatively that I have felt she had pelvic endometriosis that could not be removed safely.  I would remove her ovaries to hopefully cause shrinkage of the nodule.  She did have pain in her right leg with periods and also depending on where the penis hit in her vagina.  She had pain with intercourse.  I elevated both tubes and ovaries into the operative field, inspected both ureters, found them to be free of the operative field and removed both  tubes and ovaries, and doubly suture ligating the infundibulopelvic ligaments bilaterally.  Liberal irrigation confirmed hemostasis.  The packs and retractors were removed and the abdominal wall was closed in layers using interrupted sutures of 0 Vicryl, closed the rectus muscle and peritoneum in 1 layer, two running sutures of 0 Vicryl on the fascia, a running 3-0 Vicryl on the subcu tissue, and after I undermined the subcu tissue some to give more freedom of the skin, closed the skin with staples.  The patient seemed to tolerate the procedure well.  The anesthetist felt blood loss was 150 mL.  Sponge and needle counts were correct and she was returned to recovery in satisfactory condition.     Malachi Pro. Ambrose Mantle, M.D.     TFH/MEDQ  D:  10/17/2011  T:  10/18/2011  Job:  [id number]

## 2011-10-18 NOTE — Anesthesia Postprocedure Evaluation (Signed)
Anesthesia Post Note  Patient: [name]  Procedure(s) Performed: Procedure(s) (LRB): HYSTERECTOMY ABDOMINAL (N/A) SALPINGO OOPHERECTOMY (Bilateral)  Anesthesia type: General  Patient location: Mother/Baby  Post pain: Pain level controlled  Post assessment: Post-op Vital signs reviewed  Last Vitals:  Filed Vitals:   10/18/11 0631  BP: 143/87  Pulse: 87  Temp: 37.2 C  Resp: 18    Post vital signs: Reviewed  Level of consciousness: awake and alert   Complications: No apparent anesthesia complications

## 2011-10-18 NOTE — Progress Notes (Signed)
Patient ID: [name], female   DOB: 04-Oct-1965, 46 y.o.   MRN: [id number] #1 afebrile BP ULN HGB stable and abdomen soft and not tender.

## 2011-10-19 MED ORDER — IBUPROFEN 600 MG PO TABS: 600.0000 mg | ORAL_TABLET | Freq: Four times a day (QID) | ORAL | Status: AC | PRN

## 2011-10-19 NOTE — Discharge Instructions (Signed)
Call with temp > 100.4 Call with heavy vaginal bleeding or with any problems. No vaginal entrance

## 2011-10-19 NOTE — Progress Notes (Signed)
D/C instructions reviewed with pt. and family.  All state understanding of same.  No home equipment needed.  Wheelchair to car with staff without incident.  D/C'd home with family.

## 2011-10-19 NOTE — Discharge Summary (Signed)
[name], [name]            ACCOUNT NO.:  [id number]  MEDICAL RECORD NO.:  [id number]  LOCATION:  9320                          FACILITY:  [facility]  PHYSICIAN:  Malachi Pro. Ambrose Mantle, M.D. DATE OF BIRTH:  Aug 16, 1966  DATE OF ADMISSION:  10/17/2011 DATE OF DISCHARGE:  10/19/2011                              DISCHARGE SUMMARY   This is a 46 year old black female who was admitted for a combination of symptoms of severe menorrhalgia, dysmenorrhea, and dyspareunia with a nodule on pelvic exam in the uterosacral ligament area, thought to be secondary to endometriosis.  The patient underwent abdominal hysterectomy and bilateral salpingo-oophorectomy with findings of a nodule close to the sigmoid colon, lateral and posterior to the uterosacral ligaments.  I have told the patient that it would be wise to have a colonoscopy or sigmoidoscopy after she is healed from surgery. After the procedure, the patient has done well.  Her highest temp has been 100.3.  She is voiding well.  She is ambulating without difficulty. She is tolerating a diet.  She has not passed flatus as of yet, but her abdomen is soft and nontender.  LABORATORY DATA:  Initial hemoglobin of 12.3, hematocrit 38, white count 6300, platelet count 309,000.  Comprehensive metabolic profile was normal except for glomerular filtration rate of 87.  Followup hemoglobin was 11.3, hematocrit 35.1 white count 12,200, platelet count 269,000. Pathology report is pending.  FINAL DIAGNOSES:  Severe menorrhagia, dysmenorrhea, dyspareunia, pelvic nodule thought to be secondary to endometriosis, adenomyosis, and leiomyomata by ultrasound.  OPERATION:  Abdominal hysterectomy, bilateral salpingo-oophorectomy.  FINAL CONDITION:  Improved.  HOME INSTRUCTIONS:  Our regular discharge instructions.  No vaginal entrance.  No heavy lifting or strenuous activity.  Call with temperature elevation greater than 100.4 degrees.  Call with any problems and  return to the office in 3 days or so to have her staples removed.  DISCHARGE MEDICATIONS:  To resume her lisinopril and atorvastatin and all other medication she was taking at home.  Motrin 600 mg, #30 tablets, 1 every 6 hours as needed for pain.     Malachi Pro. Ambrose Mantle, M.D.     TFH/MEDQ  D:  10/19/2011  T:  10/19/2011  Job:  [id number]

## 2011-10-19 NOTE — Progress Notes (Signed)
Patient ID: [name], female   DOB: 1965-12-23, 46 y.o.   MRN: [id number] #2 temp max = 100.3 Abdomen soft not tender No flatus Will d/c Advised ti drink liquids until passing flatus

## 2011-10-21 ENCOUNTER — Encounter (HOSPITAL_COMMUNITY): Payer: Self-pay | Admitting: Obstetrics and Gynecology

## 2011-11-28 ENCOUNTER — Encounter: Payer: Self-pay | Admitting: Internal Medicine

## 2011-12-30 ENCOUNTER — Ambulatory Visit (INDEPENDENT_AMBULATORY_CARE_PROVIDER_SITE_OTHER): Payer: 59 | Admitting: Internal Medicine

## 2011-12-30 ENCOUNTER — Encounter: Payer: Self-pay | Admitting: Internal Medicine

## 2011-12-30 VITALS — BP 138/82 | HR 68 | Ht 66.0 in | Wt 150.0 lb

## 2011-12-30 DIAGNOSIS — R933 Abnormal findings on diagnostic imaging of other parts of digestive tract: Secondary | ICD-10-CM | POA: Insufficient documentation

## 2011-12-30 DIAGNOSIS — I10 Essential (primary) hypertension: Secondary | ICD-10-CM | POA: Insufficient documentation

## 2011-12-30 DIAGNOSIS — Z800 Family history of malignant neoplasm of digestive organs: Secondary | ICD-10-CM

## 2011-12-30 DIAGNOSIS — E785 Hyperlipidemia, unspecified: Secondary | ICD-10-CM | POA: Insufficient documentation

## 2011-12-30 MED ORDER — PEG-KCL-NACL-NASULF-NA ASC-C 100 G PO SOLR: 1.0000 | Freq: Once | ORAL | Status: DC

## 2011-12-30 NOTE — Progress Notes (Signed)
  Subjective:    Patient ID: [name], female    DOB: [birth year], 46 y.o.   MRN: [id number]  HPI This is a very pleasant middle aged [place] American woman who presents after a hysterectomy was performed. She was having a lot of pain and abnormal bleeding. There was a nodule in the right pelvis near the sigmoid colon that was thought possibly to be endometriosis. That was left in but she did have a total hysterectomy with bilateral salpingo-oophorectomy. No endometriosis was seen in the pathology report. However, because of this nodule, Dr. Ambrose Mantle is concerned about the possibility of colonic involvement and would like prognostic information. A colonoscopy has been requested. She last had a colonoscopy in 2010 that was normal, due to her father's history of colon cancer in her increased risk. There is occasional constipation and bloating and gas symptoms. Her abdominal and pelvic pain is much better after the hysterectomy.  Medications, allergies, past medical history, past surgical history, family history and social history are reviewed and updated in the EMR.   Review of Systems Positive for eye glasses, night sweats of hot flashes some pedal edema she feels like she's been more thirsty and urinating more often.    Objective:   Physical Exam General:  NAD Eyes:   anicteric Lungs:  clear Heart:  S1S2 no rubs, murmurs or gallops Abdomen:  soft and nontender, BS+ Ext:   no edema    Data Reviewed:  Operative report and discharge summary from February 2013        Assessment & Plan:   1. ? of pelvic endometrisosis   2. Family history of colon cancer     Endoscopy will be scheduled to see her she has any colonic endometriosis. Risks benefits and indications were explained she understands and agrees to proceed.   I appreciate the opportunity to care for this patient.  CC: Dr. Ambrose Mantle

## 2011-12-30 NOTE — Patient Instructions (Signed)
You have been scheduled for a colonoscopy with propofol. Please follow written instructions given to you at your visit today.  Please pick up your prep kit at the pharmacy within the next 1-3 days.  

## 2012-02-28 ENCOUNTER — Encounter: Payer: 59 | Admitting: Internal Medicine

## 2012-03-10 ENCOUNTER — Encounter: Payer: Self-pay | Admitting: Internal Medicine

## 2012-03-10 ENCOUNTER — Ambulatory Visit (AMBULATORY_SURGERY_CENTER): Payer: 59 | Admitting: Internal Medicine

## 2012-03-10 VITALS — BP 134/78 | HR 51 | Temp 95.9°F | Resp 18 | Ht 66.0 in | Wt 150.0 lb

## 2012-03-10 DIAGNOSIS — Z800 Family history of malignant neoplasm of digestive organs: Secondary | ICD-10-CM

## 2012-03-10 DIAGNOSIS — K648 Other hemorrhoids: Secondary | ICD-10-CM

## 2012-03-10 DIAGNOSIS — R933 Abnormal findings on diagnostic imaging of other parts of digestive tract: Secondary | ICD-10-CM

## 2012-03-10 MED ORDER — SODIUM CHLORIDE 0.9 % IV SOLN: 500.0000 mL | INTRAVENOUS | Status: DC

## 2012-03-10 NOTE — Progress Notes (Signed)
Patient did not experience any of the following events: a burn prior to discharge; a fall within the facility; wrong site/side/patient/procedure/implant event; or a hospital transfer or hospital admission upon discharge from the facility. (G8907) Patient did not have preoperative order for IV antibiotic SSI prophylaxis. (G8918)  

## 2012-03-10 NOTE — Op Note (Signed)
[facility] [address] [place], [place]  [address]  COLONOSCOPY PROCEDURE REPORT  PATIENT:  [name], [name]  MR#:  [id number] BIRTHDATE:  1965-11-22, 46 yrs. old  GENDER:  female ENDOSCOPIST:  Iva Boop, MD, [facility] REF. BY:  Tracey Harries, M.D. PROCEDURE DATE:  03/10/2012 PROCEDURE:  Colonoscopy 27253 ASA CLASS:  Class I INDICATIONS:  ? of endometriosis nodule suspected at laparoscopy (on/near sigmoid colon)  she also has a family history of colon cancer (father at 67) MEDICATIONS:   MAC sedation, administered by CRNA, propofol (Diprivan) 200 mg IV  DESCRIPTION OF PROCEDURE:   After the risks benefits and alternatives of the procedure were thoroughly explained, informed consent was obtained.  Digital rectal exam was performed and revealed no abnormalities.   The LB CF-[id number] endoscope was introduced through the anus and advanced to the cecum, which was identified by both the appendix and ileocecal valve, without limitations.  The quality of the prep was good, using MoviPrep. The instrument was then slowly withdrawn as the colon was fully examined. <<PROCEDUREIMAGES>>  FINDINGS:  A normal appearing cecum, ileocecal valve, and appendiceal orifice were identified. The ascending, hepatic flexure, transverse, splenic flexure, descending, sigmoid colon, and rectum appeared unremarkable.   Retroflexed views in the rectum revealed internal hemorrhoids.  Very small.  The time to cecum = 4:24 minutes. The scope was then withdrawn in 9:34 minutes from the cecum and the procedure completed. COMPLICATIONS:  None ENDOSCOPIC IMPRESSION: 1) Normal colon - no endometriosis 2) Internal hemorrhoids  REPEAT EXAM:  In 5 year(s) for routine screening colonoscopy. See Dr. Leone Payor as needed.  Iva Boop, MD, [facility]  CC:  Tracey Harries, MD and The Patient  n. eSIGNED:   Iva Boop at 03/10/2012 09:18 AM  Regis Bill, [phone]

## 2012-03-10 NOTE — Patient Instructions (Addendum)
The colonoscopy was normal except for small internal hemorrhoids. I do not see evidence of endometriosis. You should have a routine repeat colonoscopy in about 5 years for screening (colorectal cancer).  Please see me as needed for gastrointestinal problems.  Thanks for choosing [facility]  Iva Boop, MD, FACG   YOU HAD AN ENDOSCOPIC PROCEDURE TODAY AT THE [facility]: Refer to the procedure report that was given to you for any specific questions about what was found during the examination.  If the procedure report does not answer your questions, please call your gastroenterologist to clarify.  If you requested that your care partner not be given the details of your procedure findings, then the procedure report has been included in a sealed envelope for you to review at your convenience later.  YOU SHOULD EXPECT: Some feelings of bloating in the abdomen. Passage of more gas than usual.  Walking can help get rid of the air that was put into your GI tract during the procedure and reduce the bloating. If you had a lower endoscopy (such as a colonoscopy or flexible sigmoidoscopy) you may notice spotting of blood in your stool or on the toilet paper. If you underwent a bowel prep for your procedure, then you may not have a normal bowel movement for a few days.  DIET: Your first meal following the procedure should be a light meal and then it is ok to progress to your normal diet.  A half-sandwich or bowl of soup is an example of a good first meal.  Heavy or fried foods are harder to digest and may make you feel nauseous or bloated.  Likewise meals heavy in dairy and vegetables can cause extra gas to form and this can also increase the bloating.  Drink plenty of fluids but you should avoid alcoholic beverages for 24 hours.  ACTIVITY: Your care partner should take you home directly after the procedure.  You should plan to take it easy, moving slowly for the rest of the day.   You can resume normal activity the day after the procedure however you should NOT DRIVE or use heavy machinery for 24 hours (because of the sedation medicines used during the test).    SYMPTOMS TO REPORT IMMEDIATELY: A gastroenterologist can be reached at any hour.  During normal business hours, 8:30 AM to 5:00 PM Monday through Friday, call [phone].  After hours and on weekends, please call the GI answering service at [phone] who will take a message and have the physician on call contact you.   Following lower endoscopy (colonoscopy or flexible sigmoidoscopy):  Excessive amounts of blood in the stool  Significant tenderness or worsening of abdominal pains  Swelling of the abdomen that is new, acute  Fever of 100F or higher  Following upper endoscopy (EGD)  Vomiting of blood or coffee ground material  New chest pain or pain under the shoulder blades  Painful or persistently difficult swallowing  New shortness of breath  Fever of 100F or higher  Black, tarry-looking stools  FOLLOW UP: If any biopsies were taken you will be contacted by phone or by letter within the next 1-3 weeks.  Call your gastroenterologist if you have not heard about the biopsies in 3 weeks.  Our staff will call the home number listed on your records the next business day following your procedure to check on you and address any questions or concerns that you may have at that time regarding the information given  to you following your procedure. This is a courtesy call and so if there is no answer at the home number and we have not heard from you through the emergency physician on call, we will assume that you have returned to your regular daily activities without incident.  SIGNATURES/CONFIDENTIALITY: You and/or your care partner have signed paperwork which will be entered into your electronic medical record.  These signatures attest to the fact that that the information above on your After Visit Summary  has been reviewed and is understood.  Full responsibility of the confidentiality of this discharge information lies with you and/or your care-partner.

## 2012-03-11 ENCOUNTER — Telehealth: Payer: Self-pay | Admitting: *Deleted

## 2012-03-11 NOTE — Telephone Encounter (Signed)
  Follow up Call-  Call back number 03/10/2012  Post procedure Call Back phone  # [phone]  Permission to leave phone message Yes     Patient questions:  Do you have a fever, pain , or abdominal swelling? no Pain Score  0 *  Have you tolerated food without any problems? yes  Have you been able to return to your normal activities? yes  Do you have any questions about your discharge instructions: Diet   no Medications  no Follow up visit  no  Do you have questions or concerns about your Care? no  Actions: * If pain score is 4 or above: No action needed, pain <4.

## 2012-07-15 ENCOUNTER — Encounter (HOSPITAL_COMMUNITY): Payer: Self-pay | Admitting: Emergency Medicine

## 2012-07-15 ENCOUNTER — Emergency Department (HOSPITAL_COMMUNITY)
Admission: EM | Admit: 2012-07-15 | Discharge: 2012-07-15 | Disposition: A | Discharge status: Home / Self Care | Payer: Worker's Compensation | Attending: Emergency Medicine | Admitting: Emergency Medicine

## 2012-07-15 ENCOUNTER — Emergency Department (HOSPITAL_COMMUNITY): Payer: Worker's Compensation

## 2012-07-15 DIAGNOSIS — S46909A Unspecified injury of unspecified muscle, fascia and tendon at shoulder and upper arm level, unspecified arm, initial encounter: Secondary | ICD-10-CM | POA: Insufficient documentation

## 2012-07-15 DIAGNOSIS — S4980XA Other specified injuries of shoulder and upper arm, unspecified arm, initial encounter: Secondary | ICD-10-CM | POA: Insufficient documentation

## 2012-07-15 DIAGNOSIS — R51 Headache: Secondary | ICD-10-CM | POA: Insufficient documentation

## 2012-07-15 DIAGNOSIS — E785 Hyperlipidemia, unspecified: Secondary | ICD-10-CM | POA: Insufficient documentation

## 2012-07-15 DIAGNOSIS — K219 Gastro-esophageal reflux disease without esophagitis: Secondary | ICD-10-CM | POA: Insufficient documentation

## 2012-07-15 DIAGNOSIS — I10 Essential (primary) hypertension: Secondary | ICD-10-CM | POA: Insufficient documentation

## 2012-07-15 DIAGNOSIS — Y939 Activity, unspecified: Secondary | ICD-10-CM | POA: Insufficient documentation

## 2012-07-15 DIAGNOSIS — Y929 Unspecified place or not applicable: Secondary | ICD-10-CM | POA: Insufficient documentation

## 2012-07-15 DIAGNOSIS — Z79899 Other long term (current) drug therapy: Secondary | ICD-10-CM | POA: Insufficient documentation

## 2012-07-15 DIAGNOSIS — K649 Unspecified hemorrhoids: Secondary | ICD-10-CM | POA: Insufficient documentation

## 2012-07-15 MED ORDER — IBUPROFEN 800 MG PO TABS
800.0000 mg | ORAL_TABLET | Freq: Once | ORAL | Status: AC
  Administered 2012-07-15: 800 mg via ORAL
  Filled 2012-07-15: qty 1

## 2012-07-15 MED ORDER — OXYCODONE-ACETAMINOPHEN 5-325 MG PO TABS: 2.0000 | ORAL_TABLET | ORAL | Status: DC | PRN

## 2012-07-15 MED ORDER — IBUPROFEN 600 MG PO TABS: 600.0000 mg | ORAL_TABLET | Freq: Four times a day (QID) | ORAL | Status: DC | PRN
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
## 2012-07-15 NOTE — ED Notes (Signed)
Pt arrived to ED on LSD and c-collar.
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
## 2012-07-17 NOTE — ED Provider Notes (Signed)
Medical screening examination/treatment/procedure(s) were performed by non-physician practitioner and as supervising physician I was immediately available for consultation/collaboration.  Crescent Gotham R. Patra Gherardi, MD 07/17/12 1056 

## 2012-07-22 ENCOUNTER — Other Ambulatory Visit: Payer: Self-pay | Admitting: Obstetrics and Gynecology

## 2012-07-22 DIAGNOSIS — Z1231 Encounter for screening mammogram for malignant neoplasm of breast: Secondary | ICD-10-CM

## 2012-09-10 ENCOUNTER — Ambulatory Visit
Admission: RE | Admit: 2012-09-10 | Discharge: 2012-09-10 | Disposition: A | Discharge status: Home / Self Care | Payer: 59 | Source: Ambulatory Visit | Attending: Obstetrics and Gynecology | Admitting: Obstetrics and Gynecology

## 2012-09-10 DIAGNOSIS — Z1231 Encounter for screening mammogram for malignant neoplasm of breast: Secondary | ICD-10-CM

## 2012-09-14 ENCOUNTER — Other Ambulatory Visit: Payer: Self-pay | Admitting: Obstetrics and Gynecology

## 2012-09-14 DIAGNOSIS — N63 Unspecified lump in unspecified breast: Secondary | ICD-10-CM

## 2012-10-05 ENCOUNTER — Other Ambulatory Visit: Payer: Self-pay | Admitting: Obstetrics and Gynecology

## 2012-10-05 ENCOUNTER — Ambulatory Visit
Admission: RE | Admit: 2012-10-05 | Discharge: 2012-10-05 | Disposition: A | Discharge status: Home / Self Care | Payer: 59 | Source: Ambulatory Visit | Attending: Obstetrics and Gynecology | Admitting: Obstetrics and Gynecology

## 2012-10-05 DIAGNOSIS — N63 Unspecified lump in unspecified breast: Secondary | ICD-10-CM

## 2012-10-14 ENCOUNTER — Ambulatory Visit
Admission: RE | Admit: 2012-10-14 | Discharge: 2012-10-14 | Disposition: A | Discharge status: Home / Self Care | Payer: 59 | Source: Ambulatory Visit | Attending: Obstetrics and Gynecology | Admitting: Obstetrics and Gynecology

## 2012-10-14 DIAGNOSIS — N63 Unspecified lump in unspecified breast: Secondary | ICD-10-CM

## 2013-05-10 ENCOUNTER — Other Ambulatory Visit: Payer: Self-pay | Admitting: Allergy and Immunology

## 2013-05-10 ENCOUNTER — Ambulatory Visit
Admission: RE | Admit: 2013-05-10 | Discharge: 2013-05-10 | Disposition: A | Discharge status: Home / Self Care | Payer: 59 | Source: Ambulatory Visit | Attending: Allergy and Immunology | Admitting: Allergy and Immunology

## 2013-05-10 DIAGNOSIS — J45990 Exercise induced bronchospasm: Secondary | ICD-10-CM

## 2013-06-21 ENCOUNTER — Ambulatory Visit (HOSPITAL_COMMUNITY): Payer: 59 | Admitting: Psychiatry

## 2013-09-10 ENCOUNTER — Other Ambulatory Visit (HOSPITAL_COMMUNITY): Payer: Self-pay | Admitting: Obstetrics and Gynecology

## 2013-09-10 DIAGNOSIS — Z1231 Encounter for screening mammogram for malignant neoplasm of breast: Secondary | ICD-10-CM

## 2013-09-24 ENCOUNTER — Encounter: Payer: Self-pay | Admitting: Internal Medicine

## 2013-10-01 ENCOUNTER — Telehealth: Payer: Self-pay

## 2013-10-01 NOTE — Telephone Encounter (Signed)
Records sent to Elenore Rota, [place] at [facility] today after I received the payment.

## 2013-10-06 ENCOUNTER — Ambulatory Visit (HOSPITAL_COMMUNITY): Payer: 59

## 2013-11-12 ENCOUNTER — Emergency Department (HOSPITAL_COMMUNITY): Payer: 59

## 2013-11-12 ENCOUNTER — Emergency Department (HOSPITAL_COMMUNITY)
Admission: EM | Admit: 2013-11-12 | Discharge: 2013-11-12 | Disposition: A | Discharge status: Home / Self Care | Payer: 59 | Attending: Emergency Medicine | Admitting: Emergency Medicine

## 2013-11-12 ENCOUNTER — Encounter (HOSPITAL_COMMUNITY): Payer: Self-pay | Admitting: Emergency Medicine

## 2013-11-12 DIAGNOSIS — Z8639 Personal history of other endocrine, nutritional and metabolic disease: Secondary | ICD-10-CM | POA: Insufficient documentation

## 2013-11-12 DIAGNOSIS — I10 Essential (primary) hypertension: Secondary | ICD-10-CM | POA: Insufficient documentation

## 2013-11-12 DIAGNOSIS — R6884 Jaw pain: Secondary | ICD-10-CM | POA: Insufficient documentation

## 2013-11-12 DIAGNOSIS — Z79899 Other long term (current) drug therapy: Secondary | ICD-10-CM | POA: Insufficient documentation

## 2013-11-12 DIAGNOSIS — Z8742 Personal history of other diseases of the female genital tract: Secondary | ICD-10-CM | POA: Insufficient documentation

## 2013-11-12 DIAGNOSIS — Z862 Personal history of diseases of the blood and blood-forming organs and certain disorders involving the immune mechanism: Secondary | ICD-10-CM | POA: Insufficient documentation

## 2013-11-12 DIAGNOSIS — Z8719 Personal history of other diseases of the digestive system: Secondary | ICD-10-CM | POA: Insufficient documentation

## 2013-11-12 MED ORDER — ACETAMINOPHEN-CODEINE #3 300-30 MG PO TABS
1.0000 | ORAL_TABLET | Freq: Once | ORAL | Status: AC
  Administered 2013-11-12: 1 via ORAL
  Filled 2013-11-12: qty 1

## 2013-11-12 MED ORDER — ACETAMINOPHEN-CODEINE #3 300-30 MG PO TABS: 1.0000 | ORAL_TABLET | Freq: Four times a day (QID) | ORAL | Status: DC | PRN

## 2013-11-12 NOTE — ED Notes (Signed)
Pt here for ongoing workers comp inj for 2 years, reprots swelling and neck pain, unable to wear partials due to swelling. And sts bone and nerve pain.

## 2013-11-12 NOTE — ED Notes (Signed)
Patient states she did drive here, but her supervisor is with her and will have her patrol car picked up.   Patient will not be driving home.

## 2013-11-12 NOTE — Discharge Instructions (Signed)
Please follow up with your primary care physician in 1-2 days. If you do not have one please call the [facility] number listed above. Please follow up with your dentist. Please take pain medication and/or muscle relaxants as prescribed and as needed for pain. Please do not drive on narcotic pain medication or on muscle relaxants. Please read all discharge instructions and return precautions.   Temporomandibular Problems  Temporomandibular joint (TMJ) dysfunction means there are problems with the joint between your jaw and your skull. This is a joint lined by cartilage like other joints in your body but also has a small disc in the joint which keeps the bones from rubbing on each other. These joints are like other joints and can get inflamed (sore) from arthritis and other problems. When this joint gets sore, it can cause headaches and pain in the jaw and the face. CAUSES  Usually the arthritic types of problems are caused by soreness in the joint. Soreness in the joint can also be caused by overuse. This may come from grinding your teeth. It may also come from mis-alignment in the joint. DIAGNOSIS Diagnosis of this condition can often be made by history and exam. Sometimes your caregiver may need X-rays or an MRI scan to determine the exact cause. It may be necessary to see your dentist to determine if your teeth and jaws are lined up correctly. TREATMENT  Most of the time this problem is not serious; however, sometimes it can persist (become chronic). When this happens medications that will cut down on inflammation (soreness) help. Sometimes a shot of cortisone into the joint will be helpful. If your teeth are not aligned it may help for your dentist to make a splint for your mouth that can help this problem. If no physical problems can be found, the problem may come from tension. If tension is found to be the cause, biofeedback or relaxation techniques may be helpful. HOME CARE  INSTRUCTIONS   Later in the day, applications of ice packs may be helpful. Ice can be used in a plastic bag with a towel around it to prevent frostbite to skin. This may be used about every 2 hours for 20 to 30 minutes, as needed while awake, or as directed by your caregiver.  Only take over-the-counter or prescription medicines for pain, discomfort, or fever as directed by your caregiver.  If physical therapy was prescribed, follow your caregiver's directions.  Wear mouth appliances as directed if they were given. Document Released: 05/14/2001 Document Revised: 11/11/2011 Document Reviewed: 08/21/2008 [facility] Patient Information 2014 [place], [place].
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
## 2013-11-12 NOTE — ED Notes (Signed)
Patient transported to CT 

## 2013-11-15 NOTE — ED Provider Notes (Signed)
Medical screening examination/treatment/procedure(s) were performed by non-physician practitioner and as supervising physician I was immediately available for consultation/collaboration.   Leota Jacobsen, MD 11/15/13 [phone]

## 2015-05-21 IMAGING — CT CT MAXILLOFACIAL W/O CM
3 series · 16 of 47 positions shown, 19 images · non-contrast
Comparison: None.

CLINICAL DATA: Left facial pain. Subjective swelling. Chronic left
neck and arm pain related to cervical disease.

EXAM:
CT MAXILLOFACIAL WITHOUT CONTRAST
TECHNIQUE: Multidetector CT imaging of the maxillofacial structures was
performed. Multiplanar CT image reconstructions were also generated.
A small metallic BB was placed on the right temple in order to
reliably differentiate right from left.

[Series 2: facial bones · axial · 0.34mm/px · z∈[+9,+155]mm · 10 of 85 slices shown, 13 images]
[im 6/85  brain]
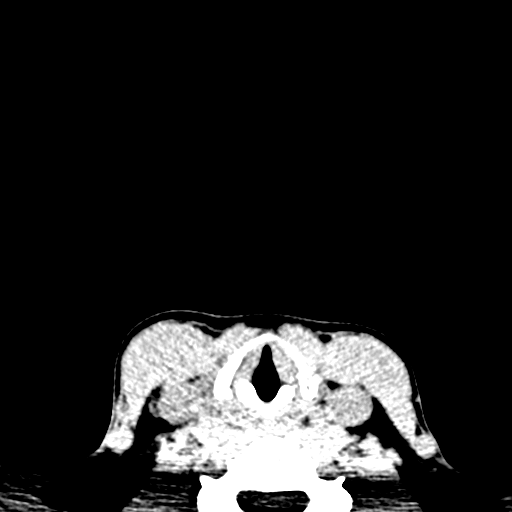
[im 6/85  bone]
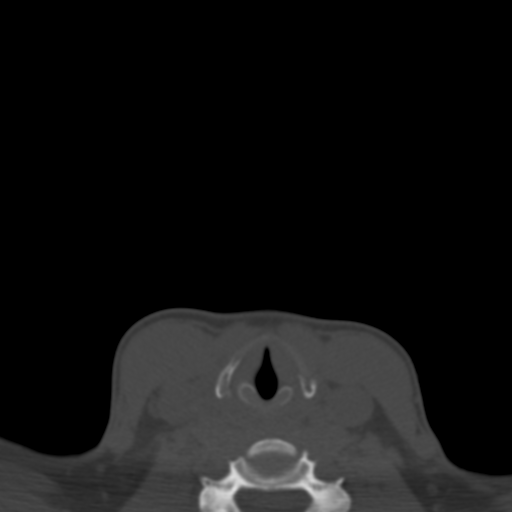
[im 15/85  bone]
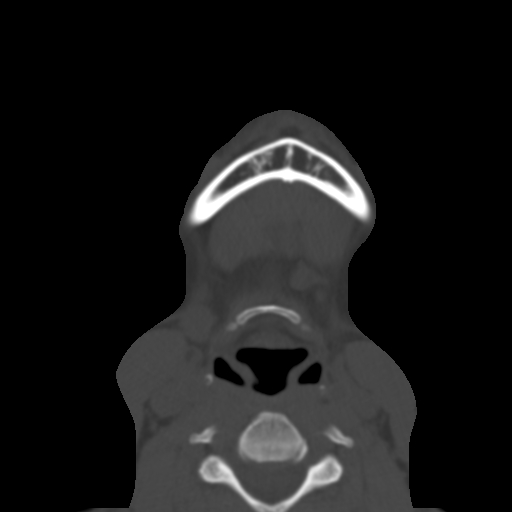
[im 24/85  bone]
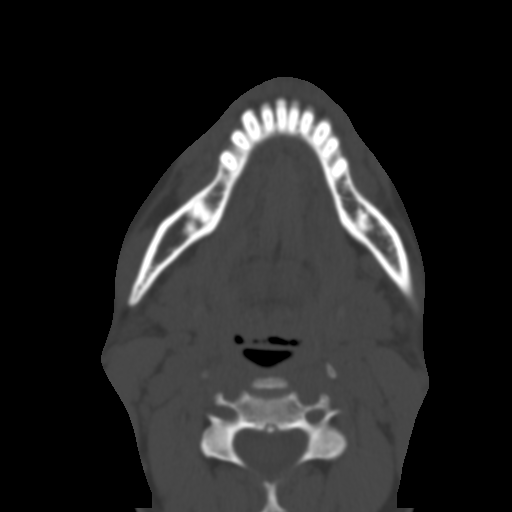
[im 29/85  bone]
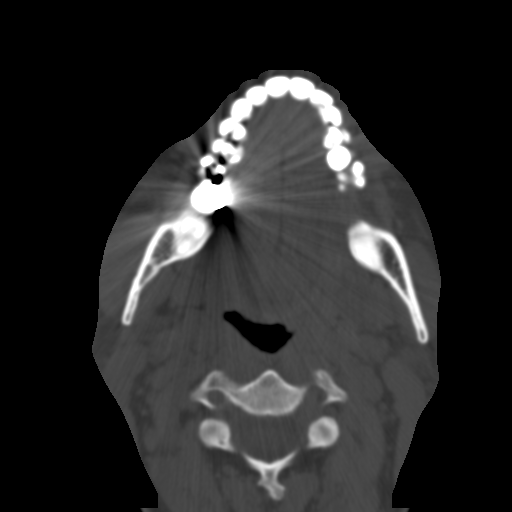
[im 38/85  brain]
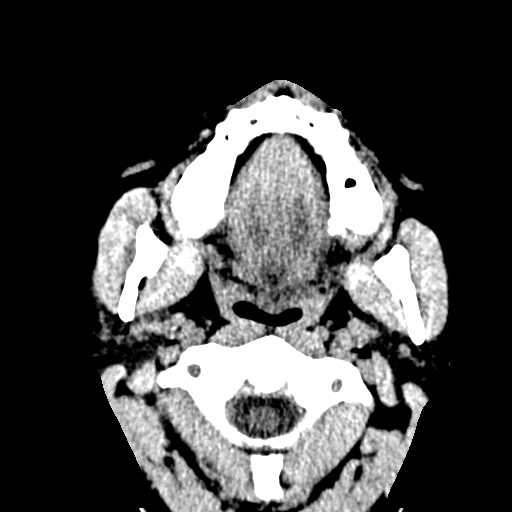
[im 38/85  bone]
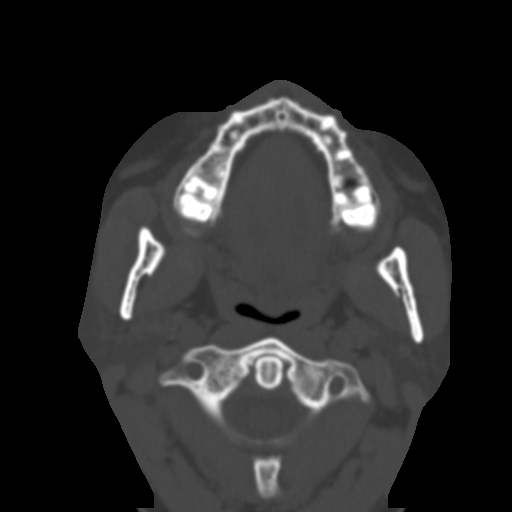
[im 47/85  bone]
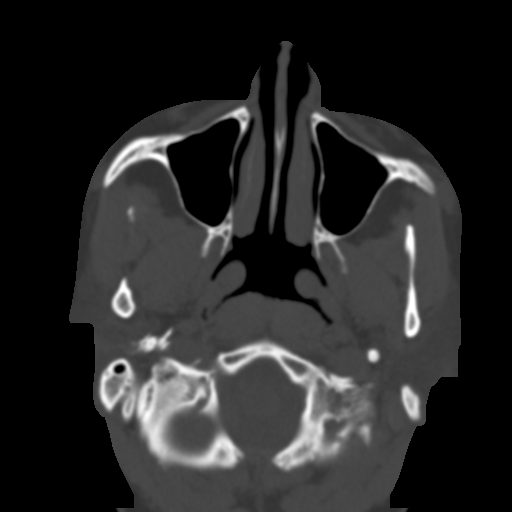
[im 56/85  bone]
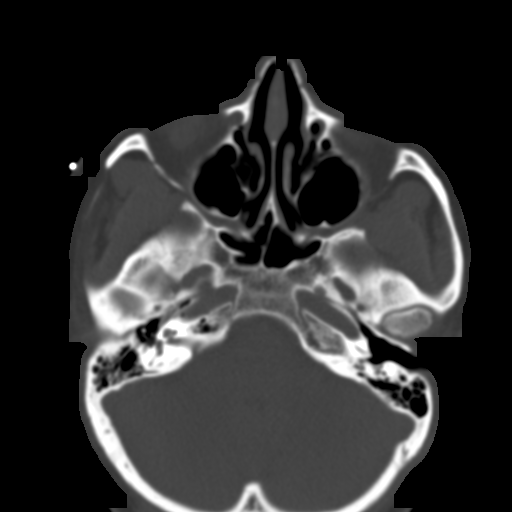
[im 64/85  bone]
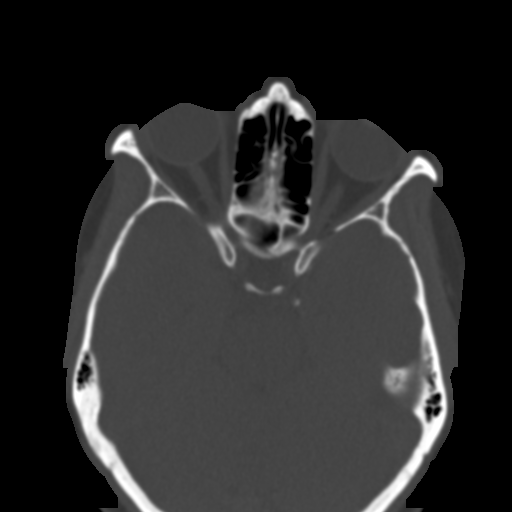
[im 70/85  brain]
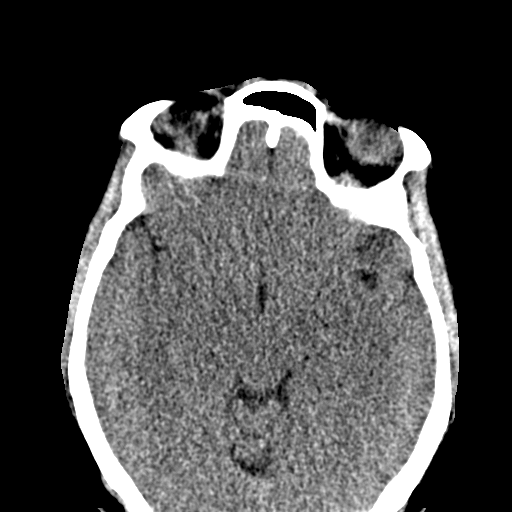
[im 70/85  bone]
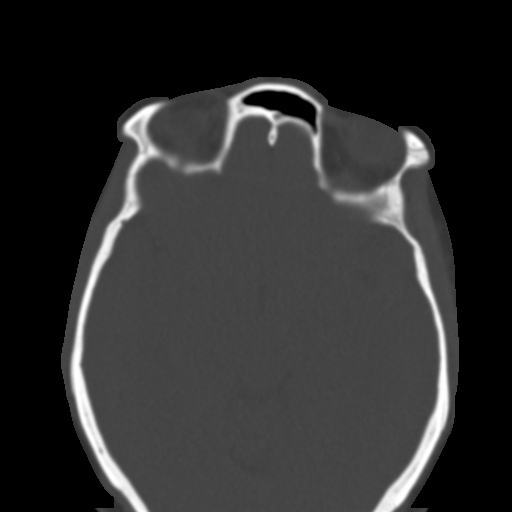
[im 79/85  bone]
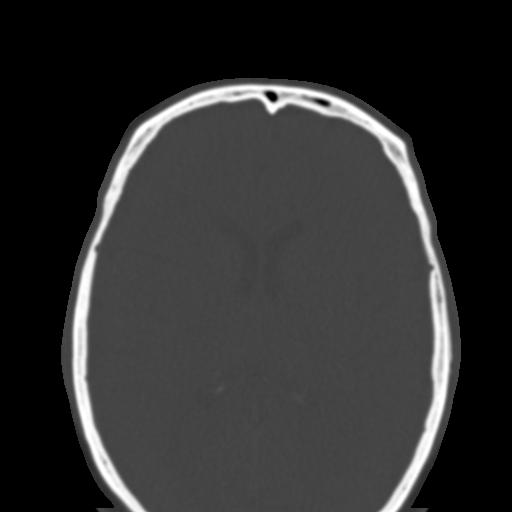

[st cor · coronal · 0.34mm/px · 3 of 80 slices shown]
[im 27/80  bone]
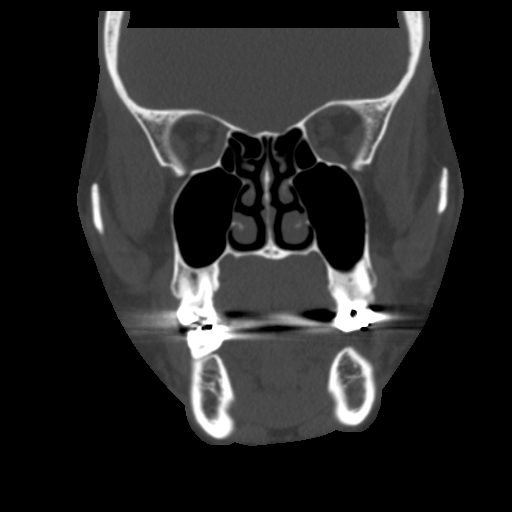
[im 36/80  bone]
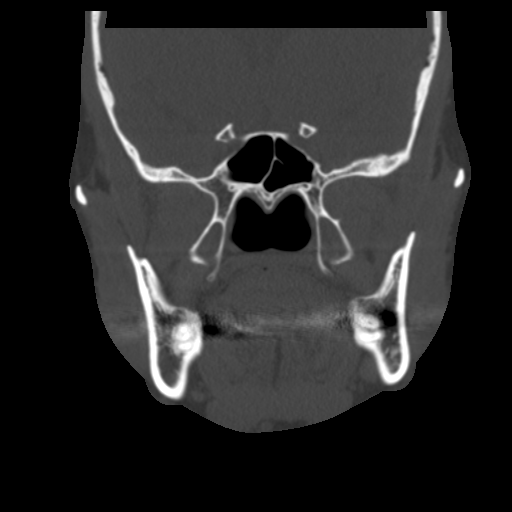
[im 44/80  bone]
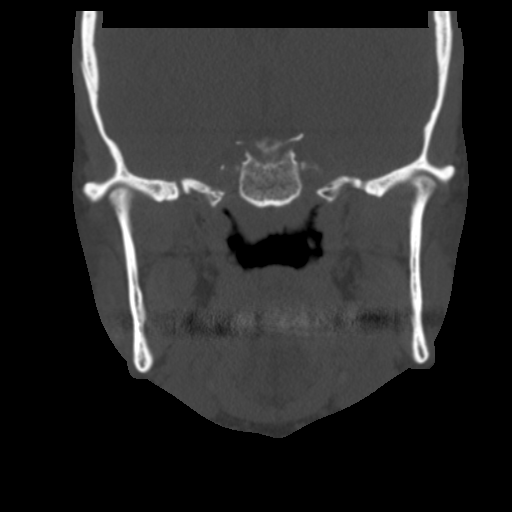

[st sag · sagittal · 0.34mm/px · 3 of 77 slices shown]
[im 26/77  bone]
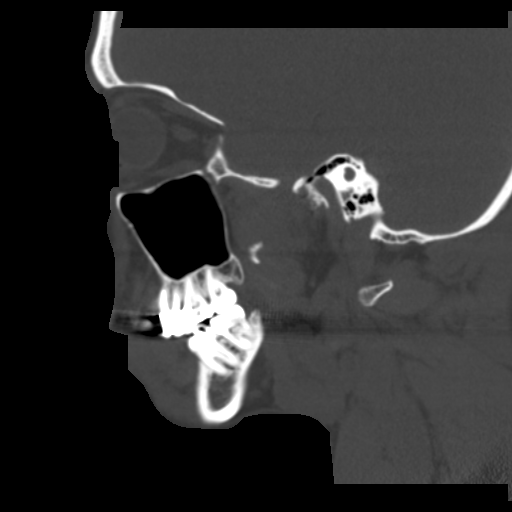
[im 39/77  bone]
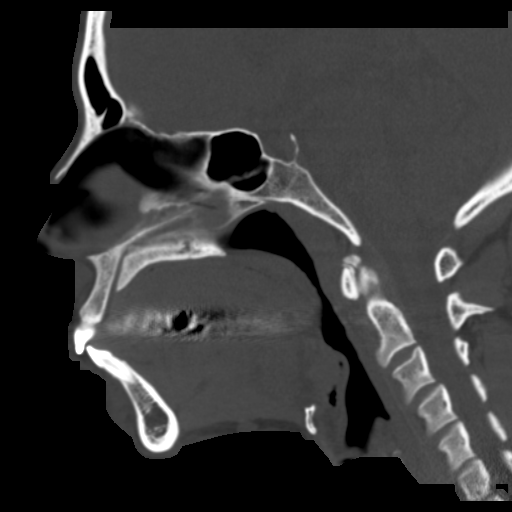
[im 51/77  bone]
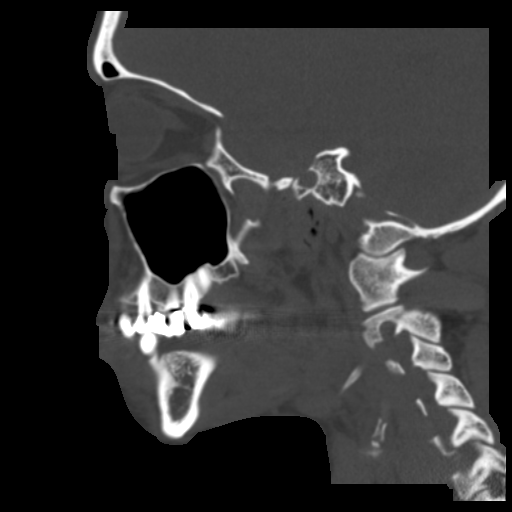

[16 of 47 positions shown; findings below may reference images not displayed]

FINDINGS: Facial bones are intact. No posttraumatic sequelae such as blowout
injury or tripod fracture. No nasal bone deformity. TMJs are
located.

Maxillary and mandibular dentition appear unremarkable. No
periapical lucencies. Areas of dental amalgam, root canal, and prior
dental extraction without acute abnormality. No subperiosteal
abscess. No oral cavity inflammatory process.

Shotty nonpathologic level I and II adenopathy. No mucosal lesion.
Unremarkable nasopharynx and mastoids. No evidence for tonsillitis.
No parapharyngeal space inflammatory process.

There is no frontal, ethmoid, sphenoid, or maxillary sinus disease.
No nasal cavity masses. Nasal septum midline. Negative orbits.

Parotid and submandibular glands are unremarkable. Normal buccinator
spaces. No salivary gland calculi. No subcutaneous inflammatory
change or abscess.
IMPRESSION: Unremarkable CT maxillofacial. No osseous, dental related, or soft
tissue abnormality. No paranasal sinus disease. No subcutaneous or
buccinator space inflammatory process is seen. No oral cavity
lesions apparent.

## 2017-04-06 ENCOUNTER — Encounter: Payer: Self-pay | Admitting: Internal Medicine

## 2017-04-28 ENCOUNTER — Encounter: Payer: Self-pay | Admitting: Internal Medicine

## 2017-04-29 ENCOUNTER — Encounter: Payer: Self-pay | Admitting: Internal Medicine

## 2019-07-06 ENCOUNTER — Other Ambulatory Visit: Payer: Self-pay

## 2019-07-06 DIAGNOSIS — Z20822 Contact with and (suspected) exposure to covid-19: Secondary | ICD-10-CM

## 2019-07-07 LAB — NOVEL CORONAVIRUS, NAA: SARS-CoV-2, NAA: NOT DETECTED

## 2024-03-09 ENCOUNTER — Ambulatory Visit: Payer: Self-pay | Admitting: Surgery

## 2024-03-10 ENCOUNTER — Other Ambulatory Visit (HOSPITAL_COMMUNITY): Payer: Self-pay | Admitting: Surgery

## 2024-03-10 DIAGNOSIS — R12 Heartburn: Secondary | ICD-10-CM

## 2024-03-10 DIAGNOSIS — K9189 Other postprocedural complications and disorders of digestive system: Secondary | ICD-10-CM

## 2024-03-10 DIAGNOSIS — K582 Mixed irritable bowel syndrome: Secondary | ICD-10-CM

## 2024-03-10 DIAGNOSIS — B9681 Helicobacter pylori [H. pylori] as the cause of diseases classified elsewhere: Secondary | ICD-10-CM

## 2024-03-10 DIAGNOSIS — K209 Esophagitis, unspecified without bleeding: Secondary | ICD-10-CM

## 2024-03-10 DIAGNOSIS — G4733 Obstructive sleep apnea (adult) (pediatric): Secondary | ICD-10-CM

## 2024-03-10 DIAGNOSIS — Z9049 Acquired absence of other specified parts of digestive tract: Secondary | ICD-10-CM

## 2024-03-16 ENCOUNTER — Ambulatory Visit: Payer: Self-pay | Admitting: Surgery

## 2024-03-22 ENCOUNTER — Ambulatory Visit (HOSPITAL_COMMUNITY)
Admission: RE | Admit: 2024-03-22 | Discharge: 2024-03-22 | Disposition: A | Discharge status: Home / Self Care | Payer: Self-pay | Source: Ambulatory Visit | Attending: Surgery | Admitting: Surgery

## 2024-03-22 DIAGNOSIS — K209 Esophagitis, unspecified without bleeding: Secondary | ICD-10-CM | POA: Diagnosis not present

## 2024-03-22 DIAGNOSIS — G4733 Obstructive sleep apnea (adult) (pediatric): Secondary | ICD-10-CM | POA: Insufficient documentation

## 2024-03-22 DIAGNOSIS — K2970 Gastritis, unspecified, without bleeding: Secondary | ICD-10-CM | POA: Insufficient documentation

## 2024-03-22 DIAGNOSIS — B9681 Helicobacter pylori [H. pylori] as the cause of diseases classified elsewhere: Secondary | ICD-10-CM | POA: Diagnosis not present

## 2024-03-22 DIAGNOSIS — R12 Heartburn: Secondary | ICD-10-CM | POA: Diagnosis not present

## 2024-03-22 DIAGNOSIS — Z9049 Acquired absence of other specified parts of digestive tract: Secondary | ICD-10-CM | POA: Insufficient documentation

## 2024-03-22 DIAGNOSIS — K9189 Other postprocedural complications and disorders of digestive system: Secondary | ICD-10-CM | POA: Diagnosis not present

## 2024-03-22 DIAGNOSIS — K582 Mixed irritable bowel syndrome: Secondary | ICD-10-CM | POA: Insufficient documentation

## 2024-03-23 ENCOUNTER — Other Ambulatory Visit (HOSPITAL_COMMUNITY): Payer: Self-pay | Admitting: Surgery

## 2024-03-23 DIAGNOSIS — G4733 Obstructive sleep apnea (adult) (pediatric): Secondary | ICD-10-CM

## 2024-03-23 DIAGNOSIS — K9189 Other postprocedural complications and disorders of digestive system: Secondary | ICD-10-CM

## 2024-03-23 DIAGNOSIS — B9681 Helicobacter pylori [H. pylori] as the cause of diseases classified elsewhere: Secondary | ICD-10-CM

## 2024-03-23 DIAGNOSIS — R12 Heartburn: Secondary | ICD-10-CM

## 2024-03-23 DIAGNOSIS — Z9049 Acquired absence of other specified parts of digestive tract: Secondary | ICD-10-CM

## 2024-03-23 DIAGNOSIS — K582 Mixed irritable bowel syndrome: Secondary | ICD-10-CM

## 2024-03-23 DIAGNOSIS — K209 Esophagitis, unspecified without bleeding: Secondary | ICD-10-CM

## 2024-03-24 ENCOUNTER — Ambulatory Visit (HOSPITAL_COMMUNITY)
Admission: RE | Admit: 2024-03-24 | Discharge: 2024-03-24 | Disposition: A | Discharge status: Home / Self Care | Source: Ambulatory Visit | Attending: Surgery | Admitting: Surgery

## 2024-03-24 ENCOUNTER — Encounter (HOSPITAL_COMMUNITY): Payer: Self-pay

## 2024-03-24 DIAGNOSIS — K9189 Other postprocedural complications and disorders of digestive system: Secondary | ICD-10-CM | POA: Insufficient documentation

## 2024-03-24 DIAGNOSIS — R12 Heartburn: Secondary | ICD-10-CM | POA: Insufficient documentation

## 2024-03-24 DIAGNOSIS — K209 Esophagitis, unspecified without bleeding: Secondary | ICD-10-CM | POA: Diagnosis present

## 2024-03-24 DIAGNOSIS — B9681 Helicobacter pylori [H. pylori] as the cause of diseases classified elsewhere: Secondary | ICD-10-CM | POA: Insufficient documentation

## 2024-03-24 DIAGNOSIS — K2970 Gastritis, unspecified, without bleeding: Secondary | ICD-10-CM | POA: Insufficient documentation

## 2024-03-24 DIAGNOSIS — G4733 Obstructive sleep apnea (adult) (pediatric): Secondary | ICD-10-CM | POA: Diagnosis present

## 2024-03-24 DIAGNOSIS — K582 Mixed irritable bowel syndrome: Secondary | ICD-10-CM | POA: Insufficient documentation

## 2024-03-24 DIAGNOSIS — Z9049 Acquired absence of other specified parts of digestive tract: Secondary | ICD-10-CM | POA: Diagnosis present

## 2024-03-24 MED ORDER — TECHNETIUM TC 99M SULFUR COLLOID
2.0000 | Freq: Once | INTRAVENOUS | Status: AC | PRN
  Administered 2024-03-24: 2 via INTRAVENOUS

## 2024-03-25 NOTE — Progress Notes (Signed)
 [facility] PROGRESS NOTE   Ms. [name] presents today to the [facility] at the request of Elspeth Schultze, MD.  Ms. [name] is scheduled for An Esophageal Manometry Study with Impedance.  The indication for the procedure is:  Heartburn, Esophagitis, Helicobacter pylori gastritis, Slipped Nissen fundoplication.  The patient has undergone the following prior GI studies: Nissen fundoplication 2002.  Patient confirms NPO status. Esophageal Clinical Questionnaire completed.  Prior to starting the procedure and once the patient was brought into the procedure room with all participating staff present, a time out was performed to confirm the correct patient's name, date of birth, medical record number, and planned procedure.  Esophageal Manometry Study Protocol Topical anesthesia was applied to the nasal passage and posterior oropharynx.  Manometry catheter # [id number] was used for this procedure.  The motility catheter with 36 circumferential sensors spaced 1 cm apart was inserted transnasally through the right nostril and advanced through the nasal passage, posterior oropharynx, through the upper esophageal sphincter, into the esophagus, through the lower esophageal sphincter, and then into the stomach as the patient sipped sterile water while sitting upright.  The patient was then placed in the supine position.  The catheter was positioned so that at least 2 distal sensors were in the stomach and 2 proximal sensors were located above the UES.  The manometry catheter was noted to be positioned such that the 48 cm catheter marker was located at the nare os.  The proximal border of the LES was noted to be located at 38 cm from the nare.    A 5 minute acclimation period was provided followed by 11 wet swallows each of a total of 5 ml of half saline and half Gatorade.  Following this, 3 rapid swallow sequences were obtained in which the patient performed 5 rapid wet  swallows each of a total of 2 ml of half saline and half Gatorade while lying supine.  3 additional wet swallows each of a total of 5 ml of half saline and half Gatorade were then obtained with the patient sitting upright.  Manometry probe successfully placed:   15:00 Manometry study completed, probe removed:  15:20   The patient tolerated the procedure well and was discharged at 15:25 and appeared well at this time.

## 2024-04-01 NOTE — Progress Notes (Signed)
 COVID Vaccine received:  []  No [x]  Yes Date of any COVID positive Test in last 90 days: no PCP - [facility] Kirke Por[facility] Cardiologist - n/a  Chest x-ray -  EKG -   Stress Test -  ECHO -  Cardiac Cath -   Bowel Prep - [x]  No  []   Yes ______  Pacemaker / ICD device [x]  No []  Yes   Spinal Cord Stimulator:[x]  No []  Yes       History of Sleep Apnea? []  No [x]  Yes   CPAP used?- []  No [x]  Yes    Does the patient monitor blood sugar?          [x]  No []  Yes  []  N/A  Patient has: []  NO Hx DM   [x]  Pre-DM                 []  DM1  []   DM2 Does patient have a [employer] or Dexacom? []  No []  Yes   Fasting Blood Sugar Ranges-  Checks Blood Sugar _____ times a day  GLP1 agonist / usual dose - Zepbound last dose 04/05/24 GLP1 instructions:  SGLT-2 inhibitors / usual dose - no SGLT-2 instructions:   Blood Thinner / Instructions:no Aspirin Instructions:no  Comments:   Activity level: Patient is able to climb a flight of stairs without difficulty; [x]  No CP  []  No SOB,    Patient can perform ADLs without assistance.   Anesthesia review:   Patient denies shortness of breath, fever, cough and chest pain at PAT appointment.  Patient verbalized understanding and agreement to the Pre-Surgical Instructions that were given to them at this PAT appointment. Patient was also educated of the need to review these PAT instructions again prior to his/her surgery.I reviewed the appropriate phone numbers to call if they have any and questions or concerns.

## 2024-04-01 NOTE — Patient Instructions (Addendum)
 SURGICAL WAITING ROOM VISITATION  Patients having surgery or a procedure may have no more than 2 support people in the waiting area - these visitors may rotate.    Children under the age of 65 must have an adult with them who is not the patient.  Visitors with respiratory illnesses are discouraged from visiting and should remain at home.  If the patient needs to stay at the hospital during part of their recovery, the visitor guidelines for inpatient rooms apply. Pre-op nurse will coordinate an appropriate time for 1 support person to accompany patient in pre-op.  This support person may not rotate.    Please refer to the [facility] website for the visitor guidelines for Inpatients (after your surgery is over and you are in a regular room).       Your procedure is scheduled on: 04/15/24   Report to [facility] Main Entrance    Report to admitting at 5:15 AM   Call this number if you have problems the morning of surgery [phone]   Do not eat food :After Midnight.   After Midnight you may have the following liquids until 4:30 AM DAY OF SURGERY  Water Non-Citrus Juices (without pulp, NO RED-Apple, White grape, White cranberry) Black Coffee (NO MILK/CREAM OR CREAMERS, sugar ok)  Clear Tea (NO MILK/CREAM OR CREAMERS, sugar ok) regular and decaf                             Plain Jell-O (NO RED)                                           Fruit ices (not with fruit pulp, NO RED)                                     Popsicles (NO RED)                                                               Sports drinks like Gatorade (NO RED)              Drink 2 Ensure/G2 drinks AT 10:00 PM the night before surgery.        The day of surgery:  Drink ONE (1) Pre-Surgery Clear Ensure at 4:30 AM the morning of surgery. Drink in one sitting. Do not sip.  This drink was given to you during your hospital  pre-op appointment visit. Nothing else to drink after completing the  Pre-Surgery  Clear Ensure.          Oral Hygiene is also important to reduce your risk of infection.                                    Remember - BRUSH YOUR TEETH THE MORNING OF SURGERY WITH YOUR REGULAR TOOTHPASTE   Stop all vitamins and herbal supplements 7 days before surgery.   Take these medicines the morning of surgery with A SIP OF WATER: atorvastatin,  buspar, fexofenadine(allegra),montelukast(singulair),oxybutynin(ditropan),pantprazole, prednisone, effexor, tylenol  if needed, nasal spray, inhaler, and eye drops Hold tirzepitide(Zepbound) for at least 7 days prior to surgery.  Bring CPAP mask and tubing day of surgery.                              You may not have any metal on your body including hair pins, jewelry, and body piercing             Do not wear make-up, lotions, powders, perfumes/cologne, or deodorant  Do not wear nail polish including gel and S&S, artificial/acrylic nails, or any other type of covering on natural nails including finger and toenails. If you have artificial nails, gel coating, etc. that needs to be removed by a nail salon please have this removed prior to surgery or surgery may need to be canceled/ delayed if the surgeon/ anesthesia feels like they are unable to be safely monitored.   Do not shave  48 hours prior to surgery.    Do not bring valuables to the hospital. [facility] IS NOT             RESPONSIBLE   FOR VALUABLES.   Contacts, glasses, dentures or bridgework may not be worn into surgery.   Bring small overnight bag day of surgery.   DO NOT BRING YOUR HOME MEDICATIONS TO THE HOSPITAL. PHARMACY WILL DISPENSE MEDICATIONS LISTED ON YOUR MEDICATION LIST TO YOU DURING YOUR ADMISSION IN THE HOSPITAL!    Patients discharged on the day of surgery will not be allowed to drive home.  Someone NEEDS to stay with you for the first 24 hours after anesthesia.   Special Instructions: Bring a copy of your healthcare power of attorney and living will documents the day  of surgery if you haven't scanned them before.              Please read over the following fact sheets you were given: IF YOU HAVE QUESTIONS ABOUT YOUR PRE-OP INSTRUCTIONS PLEASE CALL [phone] [facility]   If you received a COVID test during your pre-op visit  it is requested that you wear a mask when out in public, stay away from anyone that may not be feeling well and notify your surgeon if you develop symptoms. If you test positive for Covid or have been in contact with anyone that has tested positive in the last 10 days please notify you surgeon.    [facility] - Preparing for Surgery Before surgery, you can play an important role.  Because skin is not sterile, your skin needs to be as free of germs as possible.  You can reduce the number of germs on your skin by washing with CHG (chlorahexidine gluconate) soap before surgery.  CHG is an antiseptic cleaner which kills germs and bonds with the skin to continue killing germs even after washing. Please DO NOT use if you have an allergy to CHG or antibacterial soaps.  If your skin becomes reddened/irritated stop using the CHG and inform your nurse when you arrive at Short Stay. Do not shave (including legs and underarms) for at least 48 hours prior to the first CHG shower.  You may shave your face/neck.  Please follow these instructions carefully:  1.  Shower with CHG Soap the night before surgery and the  morning of surgery.  2.  If you choose to wash your hair, wash your hair first as usual with your normal  shampoo.  3.  After you shampoo, rinse your hair and body thoroughly to remove the shampoo.                             4.  Use CHG as you would any other liquid soap.  You can apply chg directly to the skin and wash.  Gently with a scrungie or clean washcloth.  5.  Apply the CHG Soap to your body ONLY FROM THE NECK DOWN.   Do   not use on face/ open                           Wound or open sores. Avoid contact with eyes, ears mouth and   genitals  (private parts).                       Wash face,  Genitals (private parts) with your normal soap.             6.  Wash thoroughly, paying special attention to the area where your    surgery  will be performed.  7.  Thoroughly rinse your body with warm water from the neck down.  8.  DO NOT shower/wash with your normal soap after using and rinsing off the CHG Soap.                9.  Pat yourself dry with a clean towel.            10.  Wear clean pajamas.            11.  Place clean sheets on your bed the night of your first shower and do not  sleep with pets. Day of Surgery : Do not apply any lotions/deodorants the morning of surgery.  Please wear clean clothes to the hospital/surgery center.  FAILURE TO FOLLOW THESE INSTRUCTIONS MAY RESULT IN THE CANCELLATION OF YOUR SURGERY  PATIENT SIGNATURE_________________________________  NURSE SIGNATURE__________________________________  ________________________________________________________________________

## 2024-04-02 ENCOUNTER — Encounter (HOSPITAL_COMMUNITY)
Admission: RE | Admit: 2024-04-02 | Discharge: 2024-04-02 | Disposition: A | Discharge status: Home / Self Care | Source: Ambulatory Visit | Attending: Surgery | Admitting: Surgery

## 2024-04-02 ENCOUNTER — Encounter (HOSPITAL_COMMUNITY): Payer: Self-pay

## 2024-04-02 ENCOUNTER — Other Ambulatory Visit: Payer: Self-pay

## 2024-04-02 VITALS — BP 140/86 | HR 60 | Temp 97.7°F | Resp 16 | Ht 64.5 in | Wt 192.0 lb

## 2024-04-02 DIAGNOSIS — I10 Essential (primary) hypertension: Secondary | ICD-10-CM | POA: Diagnosis not present

## 2024-04-02 DIAGNOSIS — Z01818 Encounter for other preprocedural examination: Secondary | ICD-10-CM | POA: Insufficient documentation

## 2024-04-02 HISTORY — DX: Anemia, unspecified: D64.9

## 2024-04-02 HISTORY — DX: Sleep apnea, unspecified: G47.30

## 2024-04-02 HISTORY — DX: Anxiety disorder, unspecified: F41.9

## 2024-04-02 HISTORY — DX: Unspecified osteoarthritis, unspecified site: M19.90

## 2024-04-02 HISTORY — DX: Personal history of other diseases of the digestive system: Z87.19

## 2024-04-02 HISTORY — DX: Depression, unspecified: F32.A

## 2024-04-02 LAB — BASIC METABOLIC PANEL WITH GFR
Anion gap: 10 (ref 5–15)
BUN: 12 mg/dL (ref 6–20)
CO2: 24 mmol/L (ref 22–32)
Calcium: 9.4 mg/dL (ref 8.9–10.3)
Chloride: 105 mmol/L (ref 98–111)
Creatinine, Ser: 1.03 mg/dL — ABNORMAL HIGH (ref 0.44–1.00)
GFR, Estimated: >60 mL/min (ref >60)
Glucose, Bld: 83 mg/dL (ref 70–99)
Potassium: 3.2 mmol/L — ABNORMAL LOW (ref 3.5–5.1)
Sodium: 139 mmol/L (ref 135–145)

## 2024-04-02 LAB — CBC
HCT: 41.2 % (ref 36.0–46.0)
Hemoglobin: 13.2 g/dL (ref 12.0–15.0)
MCH: 28.8 pg (ref 26.0–34.0)
MCHC: 32 g/dL (ref 30.0–36.0)
MCV: 90 fL (ref 80.0–100.0)
Platelets: 295 K/uL (ref 150–400)
RBC: 4.58 MIL/uL (ref 3.87–5.11)
RDW: 12.9 % (ref 11.5–15.5)
WBC: 4.8 K/uL (ref 4.0–10.5)
nRBC: 0 % (ref 0.0–0.2)

## 2024-04-14 NOTE — Anesthesia Preprocedure Evaluation (Addendum)
 Anesthesia Evaluation  Patient identified by MRN, date of birth, ID band Patient awake    Reviewed: Allergy & Precautions, H&P , NPO status , Patient's Chart, lab work & pertinent test results  Airway Mallampati: III  TM Distance: >3 FB Neck ROM: Full    Dental no notable dental hx. (+) Partial Upper, Partial Lower, Dental Advisory Given   Pulmonary sleep apnea and Continuous Positive Airway Pressure Ventilation    Pulmonary exam normal breath sounds clear to auscultation       Cardiovascular Exercise Tolerance: Good hypertension, Pt. on medications negative cardio ROS Normal cardiovascular exam Rhythm:Regular Rate:Normal     Neuro/Psych  PSYCHIATRIC DISORDERS Anxiety Depression    negative neurological ROS  negative psych ROS   GI/Hepatic negative GI ROS, Neg liver ROS, hiatal hernia,GERD  Medicated,,  Endo/Other  negative endocrine ROS    Renal/GU negative Renal ROS  negative genitourinary   Musculoskeletal  (+) Arthritis ,    Abdominal   Peds negative pediatric ROS (+)  Hematology negative hematology ROS (+) Blood dyscrasia, anemia   Anesthesia Other Findings   Reproductive/Obstetrics negative OB ROS                              Anesthesia Physical Anesthesia Plan  ASA: 3  Anesthesia Plan: General   Post-op Pain Management: Ofirmev  IV (intra-op)* and Ketamine  IV*   Induction: Intravenous  PONV Risk Score and Plan: 1 and Ondansetron , Dexamethasone  and Treatment may vary due to age or medical condition  Airway Management Planned: Oral ETT  Additional Equipment: None  Intra-op Plan:   Post-operative Plan: Extubation in OR  Informed Consent: I have reviewed the patients History and Physical, chart, labs and discussed the procedure including the risks, benefits and alternatives for the proposed anesthesia with the patient or authorized representative who has indicated his/her  understanding and acceptance.       Plan Discussed with: Anesthesiologist and CRNA  Anesthesia Plan Comments: (2 IV's)         Anesthesia Quick Evaluation

## 2024-04-15 ENCOUNTER — Other Ambulatory Visit: Payer: Self-pay

## 2024-04-15 ENCOUNTER — Inpatient Hospital Stay (HOSPITAL_COMMUNITY)
Admission: RE | Admit: 2024-04-15 | Discharge: 2024-04-17 | DRG: 326 | Disposition: A | Discharge status: Home / Self Care | Attending: Surgery | Admitting: Surgery

## 2024-04-15 ENCOUNTER — Encounter (HOSPITAL_COMMUNITY): Payer: Self-pay | Admitting: Surgery

## 2024-04-15 ENCOUNTER — Ambulatory Visit (HOSPITAL_COMMUNITY): Payer: Self-pay | Admitting: Anesthesiology

## 2024-04-15 ENCOUNTER — Encounter (HOSPITAL_COMMUNITY)
Admission: RE | Disposition: A | Discharge status: Home / Self Care | Payer: Self-pay | Source: Home / Self Care | Attending: Surgery

## 2024-04-15 DIAGNOSIS — K2970 Gastritis, unspecified, without bleeding: Secondary | ICD-10-CM | POA: Diagnosis present

## 2024-04-15 DIAGNOSIS — K582 Mixed irritable bowel syndrome: Secondary | ICD-10-CM | POA: Diagnosis present

## 2024-04-15 DIAGNOSIS — E669 Obesity, unspecified: Secondary | ICD-10-CM | POA: Diagnosis present

## 2024-04-15 DIAGNOSIS — J309 Allergic rhinitis, unspecified: Secondary | ICD-10-CM | POA: Insufficient documentation

## 2024-04-15 DIAGNOSIS — K2100 Gastro-esophageal reflux disease with esophagitis, without bleeding: Secondary | ICD-10-CM | POA: Diagnosis present

## 2024-04-15 DIAGNOSIS — N3281 Overactive bladder: Secondary | ICD-10-CM | POA: Diagnosis present

## 2024-04-15 DIAGNOSIS — Z9185 Personal history of military service: Secondary | ICD-10-CM

## 2024-04-15 DIAGNOSIS — I10 Essential (primary) hypertension: Secondary | ICD-10-CM

## 2024-04-15 DIAGNOSIS — Z8249 Family history of ischemic heart disease and other diseases of the circulatory system: Secondary | ICD-10-CM

## 2024-04-15 DIAGNOSIS — Y838 Other surgical procedures as the cause of abnormal reaction of the patient, or of later complication, without mention of misadventure at the time of the procedure: Secondary | ICD-10-CM | POA: Diagnosis present

## 2024-04-15 DIAGNOSIS — Z888 Allergy status to other drugs, medicaments and biological substances status: Secondary | ICD-10-CM

## 2024-04-15 DIAGNOSIS — K449 Diaphragmatic hernia without obstruction or gangrene: Secondary | ICD-10-CM

## 2024-04-15 DIAGNOSIS — D1779 Benign lipomatous neoplasm of other sites: Secondary | ICD-10-CM | POA: Diagnosis present

## 2024-04-15 DIAGNOSIS — E785 Hyperlipidemia, unspecified: Secondary | ICD-10-CM

## 2024-04-15 DIAGNOSIS — M0579 Rheumatoid arthritis with rheumatoid factor of multiple sites without organ or systems involvement: Secondary | ICD-10-CM | POA: Diagnosis present

## 2024-04-15 DIAGNOSIS — Z7985 Long-term (current) use of injectable non-insulin antidiabetic drugs: Secondary | ICD-10-CM | POA: Diagnosis not present

## 2024-04-15 DIAGNOSIS — F418 Other specified anxiety disorders: Secondary | ICD-10-CM | POA: Diagnosis not present

## 2024-04-15 DIAGNOSIS — K660 Peritoneal adhesions (postprocedural) (postinfection): Secondary | ICD-10-CM | POA: Diagnosis present

## 2024-04-15 DIAGNOSIS — K9189 Other postprocedural complications and disorders of digestive system: Secondary | ICD-10-CM | POA: Diagnosis present

## 2024-04-15 DIAGNOSIS — B9681 Helicobacter pylori [H. pylori] as the cause of diseases classified elsewhere: Secondary | ICD-10-CM | POA: Diagnosis present

## 2024-04-15 DIAGNOSIS — K259 Gastric ulcer, unspecified as acute or chronic, without hemorrhage or perforation: Secondary | ICD-10-CM | POA: Diagnosis present

## 2024-04-15 DIAGNOSIS — R7303 Prediabetes: Secondary | ICD-10-CM | POA: Diagnosis present

## 2024-04-15 DIAGNOSIS — J9859 Other diseases of mediastinum, not elsewhere classified: Secondary | ICD-10-CM | POA: Diagnosis present

## 2024-04-15 DIAGNOSIS — G4733 Obstructive sleep apnea (adult) (pediatric): Secondary | ICD-10-CM | POA: Diagnosis present

## 2024-04-15 DIAGNOSIS — Z9049 Acquired absence of other specified parts of digestive tract: Secondary | ICD-10-CM | POA: Diagnosis not present

## 2024-04-15 DIAGNOSIS — Z6833 Body mass index (BMI) 33.0-33.9, adult: Secondary | ICD-10-CM

## 2024-04-15 DIAGNOSIS — Z7951 Long term (current) use of inhaled steroids: Secondary | ICD-10-CM

## 2024-04-15 DIAGNOSIS — Z808 Family history of malignant neoplasm of other organs or systems: Secondary | ICD-10-CM

## 2024-04-15 DIAGNOSIS — Z800 Family history of malignant neoplasm of digestive organs: Secondary | ICD-10-CM

## 2024-04-15 DIAGNOSIS — M064 Inflammatory polyarthropathy: Secondary | ICD-10-CM | POA: Insufficient documentation

## 2024-04-15 DIAGNOSIS — Z91041 Radiographic dye allergy status: Secondary | ICD-10-CM

## 2024-04-15 DIAGNOSIS — Z833 Family history of diabetes mellitus: Secondary | ICD-10-CM

## 2024-04-15 DIAGNOSIS — J45909 Unspecified asthma, uncomplicated: Secondary | ICD-10-CM | POA: Diagnosis present

## 2024-04-15 DIAGNOSIS — Z803 Family history of malignant neoplasm of breast: Secondary | ICD-10-CM

## 2024-04-15 DIAGNOSIS — Z90710 Acquired absence of both cervix and uterus: Secondary | ICD-10-CM

## 2024-04-15 HISTORY — PX: XI ROBOTIC ASSISTED PARAESOPHAGEAL HERNIA REPAIR: SHX6871

## 2024-04-15 SURGERY — REPAIR, HERNIA, PARAESOPHAGEAL, ROBOT-ASSISTED
Anesthesia: General

## 2024-04-15 MED ORDER — HYDROMORPHONE HCL 2 MG/ML IJ SOLN
INTRAMUSCULAR | Status: AC
  Filled 2024-04-15: qty 1

## 2024-04-15 MED ORDER — ROCURONIUM 10MG/ML (10ML) SYRINGE FOR MEDFUSION PUMP - OPTIME
INTRAVENOUS | Status: DC | PRN
  Administered 2024-04-15: 20 mg via INTRAVENOUS
  Administered 2024-04-15: 80 mg via INTRAVENOUS
  Administered 2024-04-15: 50 mg via INTRAVENOUS

## 2024-04-15 MED ORDER — SCOPOLAMINE 1 MG/3DAYS TD PT72
MEDICATED_PATCH | TRANSDERMAL | Status: AC
  Filled 2024-04-15: qty 1

## 2024-04-15 MED ORDER — DEXAMETHASONE SODIUM PHOSPHATE 10 MG/ML IJ SOLN
INTRAMUSCULAR | Status: AC
Start: 2024-04-15 — End: 2024-04-15
  Filled 2024-04-15: qty 1

## 2024-04-15 MED ORDER — GABAPENTIN 300 MG PO CAPS
300.0000 mg | ORAL_CAPSULE | Freq: Two times a day (BID) | ORAL | Status: DC
  Administered 2024-04-15 – 2024-04-17 (×5): 300 mg via ORAL
  Filled 2024-04-15 (×5): qty 3

## 2024-04-15 MED ORDER — PROCHLORPERAZINE MALEATE 10 MG PO TABS: 10.0000 mg | ORAL_TABLET | Freq: Four times a day (QID) | ORAL | Status: DC | PRN

## 2024-04-15 MED ORDER — OXYCODONE HCL 5 MG PO TABS
5.0000 mg | ORAL_TABLET | ORAL | Status: DC | PRN
  Administered 2024-04-16 (×2): 5 mg via ORAL
  Filled 2024-04-15 (×2): qty 1

## 2024-04-15 MED ORDER — VENLAFAXINE HCL 75 MG PO TABS
75.0000 mg | ORAL_TABLET | Freq: Every day | ORAL | Status: DC
  Administered 2024-04-16 – 2024-04-17 (×2): 75 mg via ORAL
  Filled 2024-04-15 (×2): qty 1

## 2024-04-15 MED ORDER — LISINOPRIL-HYDROCHLOROTHIAZIDE 20-12.5 MG PO TABS: 1.0000 | ORAL_TABLET | Freq: Every day | ORAL | Status: DC | PRN

## 2024-04-15 MED ORDER — PHENYLEPHRINE HCL (PRESSORS) 10 MG/ML IV SOLN
INTRAVENOUS | Status: AC
  Filled 2024-04-15: qty 1

## 2024-04-15 MED ORDER — DEXMEDETOMIDINE HCL IN NACL 80 MCG/20ML IV SOLN
INTRAVENOUS | Status: DC | PRN
  Administered 2024-04-15: 10 ug via INTRAVENOUS

## 2024-04-15 MED ORDER — BUPIVACAINE LIPOSOME 1.3 % IJ SUSP: 20.0000 mL | Freq: Once | INTRAMUSCULAR | Status: DC

## 2024-04-15 MED ORDER — BUPIVACAINE-EPINEPHRINE 0.25% -1:200000 IJ SOLN
INTRAMUSCULAR | Status: DC | PRN
  Administered 2024-04-15: 60 mL

## 2024-04-15 MED ORDER — DIPHENHYDRAMINE HCL 50 MG/ML IJ SOLN: 12.5000 mg | Freq: Four times a day (QID) | INTRAMUSCULAR | Status: DC | PRN

## 2024-04-15 MED ORDER — ONDANSETRON HCL 4 MG/2ML IJ SOLN
INTRAMUSCULAR | Status: DC | PRN
  Administered 2024-04-15: 4 mg via INTRAVENOUS

## 2024-04-15 MED ORDER — ACETAMINOPHEN 10 MG/ML IV SOLN
INTRAVENOUS | Status: AC
  Filled 2024-04-15: qty 100

## 2024-04-15 MED ORDER — PROCHLORPERAZINE EDISYLATE 10 MG/2ML IJ SOLN: 5.0000 mg | Freq: Four times a day (QID) | INTRAMUSCULAR | Status: DC | PRN

## 2024-04-15 MED ORDER — MEPERIDINE HCL 100 MG/ML IJ SOLN: 6.2500 mg | INTRAMUSCULAR | Status: DC | PRN

## 2024-04-15 MED ORDER — METHOCARBAMOL 500 MG PO TABS: 500.0000 mg | ORAL_TABLET | Freq: Four times a day (QID) | ORAL | Status: DC | PRN

## 2024-04-15 MED ORDER — PHENYLEPHRINE HCL-NACL 20-0.9 MG/250ML-% IV SOLN
INTRAVENOUS | Status: DC | PRN
  Administered 2024-04-15: 50 ug/min via INTRAVENOUS

## 2024-04-15 MED ORDER — KETAMINE HCL 50 MG/5ML IJ SOSY
PREFILLED_SYRINGE | INTRAMUSCULAR | Status: AC
Start: 2024-04-15 — End: 2024-04-15
  Filled 2024-04-15: qty 5

## 2024-04-15 MED ORDER — ONDANSETRON HCL 4 MG/2ML IJ SOLN: 4.0000 mg | Freq: Four times a day (QID) | INTRAMUSCULAR | Status: DC | PRN

## 2024-04-15 MED ORDER — FLUTICASONE PROPIONATE 50 MCG/ACT NA SUSP
1.0000 | Freq: Every day | NASAL | Status: DC
  Administered 2024-04-16 – 2024-04-17 (×2): 1 via NASAL
  Filled 2024-04-15: qty 16

## 2024-04-15 MED ORDER — HYDROCODONE-ACETAMINOPHEN 5-325 MG PO TABS: 1.0000 | ORAL_TABLET | ORAL | Status: DC | PRN

## 2024-04-15 MED ORDER — ONDANSETRON HCL 4 MG PO TABS: 4.0000 mg | ORAL_TABLET | Freq: Three times a day (TID) | ORAL | 10 refills | Status: AC | PRN

## 2024-04-15 MED ORDER — FENTANYL CITRATE (PF) 250 MCG/5ML IJ SOLN
INTRAMUSCULAR | Status: AC
  Filled 2024-04-15: qty 5

## 2024-04-15 MED ORDER — MIDAZOLAM HCL 2 MG/2ML IJ SOLN
INTRAMUSCULAR | Status: AC
  Filled 2024-04-15: qty 2

## 2024-04-15 MED ORDER — LACTATED RINGERS IV SOLN: INTRAVENOUS | Status: DC

## 2024-04-15 MED ORDER — CELECOXIB 200 MG PO CAPS: 200.0000 mg | ORAL_CAPSULE | ORAL | Status: DC

## 2024-04-15 MED ORDER — CEFAZOLIN SODIUM 1 G IJ SOLR
INTRAMUSCULAR | Status: AC
  Filled 2024-04-15: qty 20

## 2024-04-15 MED ORDER — SUGAMMADEX SODIUM 200 MG/2ML IV SOLN
INTRAVENOUS | Status: DC | PRN
  Administered 2024-04-15: 400 mg via INTRAVENOUS

## 2024-04-15 MED ORDER — MAGNESIUM HYDROXIDE 400 MG/5ML PO SUSP
30.0000 mL | Freq: Every day | ORAL | Status: DC | PRN
Start: 2024-04-15 — End: 2024-04-17
  Administered 2024-04-16: 30 mL via ORAL
  Filled 2024-04-15: qty 30

## 2024-04-15 MED ORDER — BISACODYL 10 MG RE SUPP
10.0000 mg | Freq: Every day | RECTAL | Status: DC | PRN
  Administered 2024-04-16: 10 mg via RECTAL
  Filled 2024-04-15: qty 1

## 2024-04-15 MED ORDER — SUGAMMADEX SODIUM 200 MG/2ML IV SOLN
INTRAVENOUS | Status: AC
  Filled 2024-04-15: qty 2

## 2024-04-15 MED ORDER — BUPIVACAINE LIPOSOME 1.3 % IJ SUSP
INTRAMUSCULAR | Status: AC
Start: 2024-04-15 — End: 2024-04-15
  Filled 2024-04-15: qty 20

## 2024-04-15 MED ORDER — LIDOCAINE 2% (20 MG/ML) 5 ML SYRINGE
INTRAMUSCULAR | Status: DC | PRN
  Administered 2024-04-15: 100 mg via INTRAVENOUS

## 2024-04-15 MED ORDER — FENTANYL CITRATE PF 50 MCG/ML IJ SOSY: 25.0000 ug | PREFILLED_SYRINGE | INTRAMUSCULAR | Status: DC | PRN

## 2024-04-15 MED ORDER — SIMETHICONE 80 MG PO CHEW
40.0000 mg | CHEWABLE_TABLET | Freq: Four times a day (QID) | ORAL | Status: DC | PRN
  Filled 2024-04-15: qty 1

## 2024-04-15 MED ORDER — PHENYLEPHRINE HCL (PRESSORS) 10 MG/ML IV SOLN
INTRAVENOUS | Status: DC | PRN
  Administered 2024-04-15: 200 ug via INTRAVENOUS
  Administered 2024-04-15: 160 ug via INTRAVENOUS
  Administered 2024-04-15 (×2): 80 ug via INTRAVENOUS
  Administered 2024-04-15 (×2): 160 ug via INTRAVENOUS

## 2024-04-15 MED ORDER — ENOXAPARIN SODIUM 40 MG/0.4ML IJ SOSY
40.0000 mg | PREFILLED_SYRINGE | INTRAMUSCULAR | Status: DC
  Administered 2024-04-16 – 2024-04-17 (×2): 40 mg via SUBCUTANEOUS
  Filled 2024-04-15 (×2): qty 0.4

## 2024-04-15 MED ORDER — MENTHOL 3 MG MT LOZG: 1.0000 | LOZENGE | OROMUCOSAL | Status: DC | PRN

## 2024-04-15 MED ORDER — BUPIVACAINE-EPINEPHRINE (PF) 0.25% -1:200000 IJ SOLN
INTRAMUSCULAR | Status: AC
  Filled 2024-04-15: qty 30

## 2024-04-15 MED ORDER — EPHEDRINE SULFATE (PRESSORS) 50 MG/ML IJ SOLN
INTRAMUSCULAR | Status: DC | PRN
  Administered 2024-04-15: 10 mg via INTRAVENOUS

## 2024-04-15 MED ORDER — SCOPOLAMINE 1 MG/3DAYS TD PT72
1.0000 | MEDICATED_PATCH | TRANSDERMAL | Status: AC
  Administered 2024-04-15: 1 via TRANSDERMAL

## 2024-04-15 MED ORDER — PROPOFOL 10 MG/ML IV BOLUS
INTRAVENOUS | Status: AC
  Filled 2024-04-15: qty 20

## 2024-04-15 MED ORDER — HYDROMORPHONE HCL 1 MG/ML IJ SOLN: 0.5000 mg | INTRAMUSCULAR | Status: DC | PRN

## 2024-04-15 MED ORDER — OXYCODONE HCL 5 MG PO TABS: 5.0000 mg | ORAL_TABLET | Freq: Once | ORAL | Status: DC | PRN

## 2024-04-15 MED ORDER — DEXAMETHASONE SODIUM PHOSPHATE 10 MG/ML IJ SOLN: 4.0000 mg | INTRAMUSCULAR | Status: DC

## 2024-04-15 MED ORDER — BUDESONIDE 0.25 MG/2ML IN SUSP
0.2500 mg | Freq: Two times a day (BID) | RESPIRATORY_TRACT | Status: DC
  Administered 2024-04-15 – 2024-04-17 (×4): 0.25 mg via RESPIRATORY_TRACT
  Filled 2024-04-15 (×4): qty 2

## 2024-04-15 MED ORDER — MONTELUKAST SODIUM 10 MG PO TABS
10.0000 mg | ORAL_TABLET | Freq: Every day | ORAL | Status: DC
  Administered 2024-04-15 – 2024-04-16 (×2): 10 mg via ORAL
  Filled 2024-04-15 (×2): qty 1

## 2024-04-15 MED ORDER — ACETAMINOPHEN 500 MG PO TABS
1000.0000 mg | ORAL_TABLET | Freq: Four times a day (QID) | ORAL | Status: DC
  Administered 2024-04-15 – 2024-04-16 (×3): 1000 mg via ORAL
  Filled 2024-04-15 (×4): qty 2

## 2024-04-15 MED ORDER — KETAMINE HCL 50 MG/5ML IJ SOSY
PREFILLED_SYRINGE | INTRAMUSCULAR | Status: AC
  Filled 2024-04-15: qty 5

## 2024-04-15 MED ORDER — HYDROCHLOROTHIAZIDE 12.5 MG PO TABS: 12.5000 mg | ORAL_TABLET | Freq: Every day | ORAL | Status: DC | PRN

## 2024-04-15 MED ORDER — PHENOL 1.4 % MT LIQD
2.0000 | OROMUCOSAL | Status: DC | PRN
Start: 2024-04-15 — End: 2024-04-17

## 2024-04-15 MED ORDER — TRAZODONE HCL 50 MG PO TABS: 25.0000 mg | ORAL_TABLET | Freq: Every evening | ORAL | Status: DC | PRN

## 2024-04-15 MED ORDER — ROCURONIUM BROMIDE 10 MG/ML (PF) SYRINGE
PREFILLED_SYRINGE | INTRAVENOUS | Status: AC
  Filled 2024-04-15: qty 10

## 2024-04-15 MED ORDER — SODIUM CHLORIDE 0.9 % IV SOLN: 2.0000 g | INTRAVENOUS | Status: DC

## 2024-04-15 MED ORDER — FENTANYL CITRATE (PF) 250 MCG/5ML IJ SOLN
INTRAMUSCULAR | Status: DC | PRN
  Administered 2024-04-15: 50 ug via INTRAVENOUS
  Administered 2024-04-15 (×2): 100 ug via INTRAVENOUS

## 2024-04-15 MED ORDER — EPHEDRINE 5 MG/ML INJ
INTRAVENOUS | Status: AC
  Filled 2024-04-15: qty 5

## 2024-04-15 MED ORDER — HYDROCODONE-ACETAMINOPHEN 5-325 MG PO TABS: 1.0000 | ORAL_TABLET | Freq: Four times a day (QID) | ORAL | 0 refills | Status: AC | PRN

## 2024-04-15 MED ORDER — EPINEPHRINE 0.3 MG/0.3ML IJ SOAJ
0.3000 mg | INTRAMUSCULAR | Status: DC | PRN
  Filled 2024-04-15: qty 0.3

## 2024-04-15 MED ORDER — HYDROMORPHONE HCL 1 MG/ML IJ SOLN
INTRAMUSCULAR | Status: DC | PRN
  Administered 2024-04-15 (×2): .5 mg via INTRAVENOUS

## 2024-04-15 MED ORDER — MAGIC MOUTHWASH
15.0000 mL | Freq: Four times a day (QID) | ORAL | Status: DC | PRN
Start: 2024-04-15 — End: 2024-04-17
  Filled 2024-04-15: qty 15

## 2024-04-15 MED ORDER — OXYBUTYNIN CHLORIDE 5 MG PO TABS
10.0000 mg | ORAL_TABLET | Freq: Two times a day (BID) | ORAL | Status: DC
  Administered 2024-04-15 – 2024-04-17 (×4): 10 mg via ORAL
  Filled 2024-04-15 (×4): qty 2

## 2024-04-15 MED ORDER — LACTATED RINGERS IV SOLN: Freq: Three times a day (TID) | INTRAVENOUS | Status: DC | PRN

## 2024-04-15 MED ORDER — SODIUM CHLORIDE 0.9 % IV SOLN
2.0000 g | INTRAVENOUS | Status: AC
  Administered 2024-04-15: 2 g via INTRAVENOUS

## 2024-04-15 MED ORDER — ROCURONIUM BROMIDE 10 MG/ML (PF) SYRINGE
PREFILLED_SYRINGE | INTRAVENOUS | Status: AC
Start: 2024-04-15 — End: 2024-04-15
  Filled 2024-04-15: qty 10

## 2024-04-15 MED ORDER — ACETAMINOPHEN 500 MG PO TABS
1000.0000 mg | ORAL_TABLET | ORAL | Status: AC
  Administered 2024-04-15: 1000 mg via ORAL

## 2024-04-15 MED ORDER — PHENYLEPHRINE 80 MCG/ML (10ML) SYRINGE FOR IV PUSH (FOR BLOOD PRESSURE SUPPORT)
PREFILLED_SYRINGE | INTRAVENOUS | Status: AC
  Filled 2024-04-15: qty 10

## 2024-04-15 MED ORDER — SODIUM CHLORIDE 0.9% FLUSH: 3.0000 mL | INTRAVENOUS | Status: DC | PRN

## 2024-04-15 MED ORDER — ALBUTEROL SULFATE (2.5 MG/3ML) 0.083% IN NEBU: 3.0000 mL | INHALATION_SOLUTION | Freq: Four times a day (QID) | RESPIRATORY_TRACT | Status: DC | PRN

## 2024-04-15 MED ORDER — KETAMINE HCL 10 MG/ML IJ SOLN
INTRAMUSCULAR | Status: DC | PRN
  Administered 2024-04-15: 20 mg via INTRAVENOUS
  Administered 2024-04-15: 30 mg via INTRAVENOUS

## 2024-04-15 MED ORDER — 0.9 % SODIUM CHLORIDE (POUR BTL) OPTIME
TOPICAL | Status: DC | PRN
  Administered 2024-04-15: 1000 mL

## 2024-04-15 MED ORDER — DIPHENHYDRAMINE HCL 12.5 MG/5ML PO ELIX: 12.5000 mg | ORAL_SOLUTION | Freq: Four times a day (QID) | ORAL | Status: DC | PRN

## 2024-04-15 MED ORDER — SODIUM CHLORIDE 0.9% FLUSH
3.0000 mL | Freq: Two times a day (BID) | INTRAVENOUS | Status: DC
  Administered 2024-04-15 – 2024-04-17 (×5): 3 mL via INTRAVENOUS

## 2024-04-15 MED ORDER — CHLORHEXIDINE GLUCONATE 0.12 % MT SOLN
15.0000 mL | Freq: Once | OROMUCOSAL | Status: AC
  Administered 2024-04-15: 15 mL via OROMUCOSAL

## 2024-04-15 MED ORDER — ONDANSETRON HCL 4 MG/2ML IJ SOLN: 4.0000 mg | Freq: Once | INTRAMUSCULAR | Status: DC | PRN

## 2024-04-15 MED ORDER — ONDANSETRON 4 MG PO TBDP
4.0000 mg | ORAL_TABLET | Freq: Four times a day (QID) | ORAL | Status: DC | PRN
Start: 2024-04-15 — End: 2024-04-17

## 2024-04-15 MED ORDER — DEXAMETHASONE SODIUM PHOSPHATE 10 MG/ML IJ SOLN
INTRAMUSCULAR | Status: DC | PRN
  Administered 2024-04-15: 10 mg via INTRAVENOUS

## 2024-04-15 MED ORDER — PANTOPRAZOLE SODIUM 40 MG PO TBEC
40.0000 mg | DELAYED_RELEASE_TABLET | Freq: Every day | ORAL | Status: DC
  Administered 2024-04-15 – 2024-04-17 (×3): 40 mg via ORAL
  Filled 2024-04-15 (×3): qty 1

## 2024-04-15 MED ORDER — MIDAZOLAM HCL 2 MG/2ML IJ SOLN
INTRAMUSCULAR | Status: DC | PRN
  Administered 2024-04-15: 2 mg via INTRAVENOUS

## 2024-04-15 MED ORDER — BUSPIRONE HCL 5 MG PO TABS: 15.0000 mg | ORAL_TABLET | Freq: Every day | ORAL | Status: DC | PRN

## 2024-04-15 MED ORDER — METOPROLOL TARTRATE 5 MG/5ML IV SOLN: 5.0000 mg | Freq: Four times a day (QID) | INTRAVENOUS | Status: DC | PRN

## 2024-04-15 MED ORDER — LISINOPRIL 20 MG PO TABS: 20.0000 mg | ORAL_TABLET | Freq: Every day | ORAL | Status: DC | PRN

## 2024-04-15 MED ORDER — KCL IN DEXTROSE-NACL 20-5-0.45 MEQ/L-%-% IV SOLN
INTRAVENOUS | Status: DC
  Filled 2024-04-15: qty 1000

## 2024-04-15 MED ORDER — PHENYLEPHRINE 80 MCG/ML (10ML) SYRINGE FOR IV PUSH (FOR BLOOD PRESSURE SUPPORT)
PREFILLED_SYRINGE | INTRAVENOUS | Status: AC
Start: 2024-04-15 — End: 2024-04-15
  Filled 2024-04-15: qty 10

## 2024-04-15 MED ORDER — ONDANSETRON HCL 4 MG/2ML IJ SOLN
INTRAMUSCULAR | Status: AC
  Filled 2024-04-15: qty 2

## 2024-04-15 MED ORDER — PROPOFOL 10 MG/ML IV BOLUS
INTRAVENOUS | Status: DC | PRN
  Administered 2024-04-15: 150 mg via INTRAVENOUS

## 2024-04-15 MED ORDER — KETOROLAC TROMETHAMINE 30 MG/ML IJ SOLN
INTRAMUSCULAR | Status: DC | PRN
  Administered 2024-04-15: 30 mg via INTRAVENOUS

## 2024-04-15 MED ORDER — POLYVINYL ALCOHOL 1.4 % OP SOLN
1.0000 [drp] | Freq: Every day | OPHTHALMIC | Status: DC | PRN
  Filled 2024-04-15: qty 15

## 2024-04-15 MED ORDER — GABAPENTIN 300 MG PO CAPS: 300.0000 mg | ORAL_CAPSULE | ORAL | Status: DC

## 2024-04-15 MED ORDER — ORAL CARE MOUTH RINSE: 15.0000 mL | Freq: Once | OROMUCOSAL | Status: AC

## 2024-04-15 MED ORDER — SODIUM CHLORIDE 0.9 % IV SOLN: 250.0000 mL | INTRAVENOUS | Status: DC | PRN

## 2024-04-15 MED ORDER — SODIUM CHLORIDE 0.9 % IV SOLN
INTRAVENOUS | Status: AC
  Filled 2024-04-15: qty 20

## 2024-04-15 MED ORDER — ATORVASTATIN CALCIUM 10 MG PO TABS
10.0000 mg | ORAL_TABLET | Freq: Every day | ORAL | Status: DC
  Administered 2024-04-15 – 2024-04-16 (×2): 10 mg via ORAL
  Filled 2024-04-15 (×2): qty 1

## 2024-04-15 MED ORDER — OXYCODONE HCL 5 MG/5ML PO SOLN: 5.0000 mg | Freq: Once | ORAL | Status: DC | PRN

## 2024-04-15 SURGICAL SUPPLY — 63 items
APPLICATOR COTTON TIP 6 STRL (MISCELLANEOUS) ×2 IMPLANT
BAG COUNTER SPONGE SURGICOUNT (BAG) ×2 IMPLANT
BLADE SURG SZ11 CARB STEEL (BLADE) ×2 IMPLANT
CHLORAPREP W/TINT 26 (MISCELLANEOUS) ×2 IMPLANT
CLIP APPLIE 5 13 M/L LIGAMAX5 (MISCELLANEOUS) IMPLANT
COVER SURGICAL LIGHT HANDLE (MISCELLANEOUS) ×2 IMPLANT
COVER TIP SHEARS 8 DVNC (MISCELLANEOUS) IMPLANT
DEFOGGER SCOPE WARM SEASHARP (MISCELLANEOUS) IMPLANT
DRAIN CHANNEL 19F RND (DRAIN) IMPLANT
DRAIN PENROSE 0.5X18 (DRAIN) IMPLANT
DRAPE ARM DVNC X/XI (DISPOSABLE) ×8 IMPLANT
DRAPE COLUMN DVNC XI (DISPOSABLE) ×2 IMPLANT
DRAPE WARM FLUID 44X44 (DRAPES) ×2 IMPLANT
DRIVER NDL LRG 8 DVNC XI (INSTRUMENTS) ×4 IMPLANT
DRIVER NDL MEGA SUTCUT DVNCXI (INSTRUMENTS) ×2 IMPLANT
DRIVER NDLE LRG 8 DVNC XI (INSTRUMENTS) IMPLANT
DRIVER NDLE MEGA SUTCUT DVNCXI (INSTRUMENTS) IMPLANT
DRSG TEGADERM 2-3/8X2-3/4 SM (GAUZE/BANDAGES/DRESSINGS) ×10 IMPLANT
ELECT REM PT RETURN 15FT ADLT (MISCELLANEOUS) ×2 IMPLANT
ENDOLOOP SUT PDS II 0 18 (SUTURE) IMPLANT
EVACUATOR DRAINAGE 10X20 100CC (DRAIN) IMPLANT
EVACUATOR SILICONE 100CC (DRAIN) IMPLANT
FELT TEFLON 4 X1 (Mesh General) ×2 IMPLANT
FORCEPS BPLR FENES DVNC XI (FORCEP) IMPLANT
FORCEPS PROGRASP DVNC XI (FORCEP) ×2 IMPLANT
GAUZE SPONGE 2X2 8PLY STRL LF (GAUZE/BANDAGES/DRESSINGS) ×2 IMPLANT
GLOVE ECLIPSE 8.0 STRL XLNG CF (GLOVE) ×4 IMPLANT
GLOVE INDICATOR 8.0 STRL GRN (GLOVE) ×4 IMPLANT
GOWN STRL REUS W/ TWL XL LVL3 (GOWN DISPOSABLE) ×4 IMPLANT
GRASPER SUT TROCAR 14GX15 (MISCELLANEOUS) IMPLANT
GRASPER TIP-UP FEN DVNC XI (INSTRUMENTS) ×2 IMPLANT
IRRIGATION SUCT STRKRFLW 2 WTP (MISCELLANEOUS) ×2 IMPLANT
KIT BASIN OR (CUSTOM PROCEDURE TRAY) ×2 IMPLANT
KIT TURNOVER KIT A (KITS) ×2 IMPLANT
MESH BIO-A 7X10 SYN MAT (Mesh General) IMPLANT
NDL HYPO 22X1.5 SAFETY MO (MISCELLANEOUS) ×2 IMPLANT
NDL INSUFFLATION 14GA 120MM (NEEDLE) IMPLANT
NEEDLE HYPO 22X1.5 SAFETY MO (MISCELLANEOUS) ×2 IMPLANT
NEEDLE INSUFFLATION 14GA 120MM (NEEDLE) ×2 IMPLANT
PACK CARDIOVASCULAR III (CUSTOM PROCEDURE TRAY) ×2 IMPLANT
PAD POSITIONING PINK XL (MISCELLANEOUS) ×2 IMPLANT
PENCIL SMOKE EVACUATOR (MISCELLANEOUS) IMPLANT
SCISSORS LAP 5X45 EPIX DISP (ENDOMECHANICALS) IMPLANT
SCISSORS MNPLR CVD DVNC XI (INSTRUMENTS) ×2 IMPLANT
SEAL UNIV 5-12 XI (MISCELLANEOUS) ×6 IMPLANT
SEALER VESSEL EXT DVNC XI (MISCELLANEOUS) ×2 IMPLANT
SOLUTION ELECTROSURG ANTI STCK (MISCELLANEOUS) ×2 IMPLANT
SPIKE FLUID TRANSFER (MISCELLANEOUS) ×2 IMPLANT
STOPCOCK 4 WAY LG BORE MALE ST (IV SETS) ×4 IMPLANT
SUT ETHIBOND 0 36 GRN (SUTURE) ×4 IMPLANT
SUT ETHIBOND NAB CT1 #1 30IN (SUTURE) ×6 IMPLANT
SUT MNCRL AB 4-0 PS2 18 (SUTURE) ×2 IMPLANT
SUT PROLENE 2 0 SH DA (SUTURE) IMPLANT
SUT STRATA PDS 2-0 23 CT-1 (SUTURE) IMPLANT
SUT VICRYL 0 UR6 27IN ABS (SUTURE) IMPLANT
SUTURE V-LC BRB 180 2/0GR6GS22 (SUTURE) IMPLANT
SYR 10ML LL (SYRINGE) ×2 IMPLANT
SYR 20ML LL LF (SYRINGE) ×2 IMPLANT
TOWEL OR 17X26 10 PK STRL BLUE (TOWEL DISPOSABLE) ×2 IMPLANT
TRAY FOLEY MTR SLVR 14FR STAT (SET/KITS/TRAYS/PACK) IMPLANT
TRAY FOLEY MTR SLVR 16FR STAT (SET/KITS/TRAYS/PACK) IMPLANT
TROCAR ADV FIXATION 5X100MM (TROCAR) ×2 IMPLANT
TUBING INSUFFLATION 10FT LAP (TUBING) ×2 IMPLANT

## 2024-04-15 NOTE — Anesthesia Postprocedure Evaluation (Signed)
 Anesthesia Post Note  Patient: [name]  Procedure(s) Performed: REPAIR, HERNIA, PARAESOPHAGEAL, ROBOT-ASSISTED FUNDOPLICATION, NISSEN, ROBOT-ASSISTED, LAPAROSCOPIC     Patient location during evaluation: PACU Anesthesia Type: General Level of consciousness: awake and alert Pain management: pain level controlled Vital Signs Assessment: post-procedure vital signs reviewed and stable Respiratory status: spontaneous breathing, nonlabored ventilation, respiratory function stable and patient connected to nasal cannula oxygen Cardiovascular status: blood pressure returned to baseline and stable Postop Assessment: no apparent nausea or vomiting Anesthetic complications: no   No notable events documented.  Last Vitals:  Vitals:   04/15/24 1200 04/15/24 1215  BP: (!) 149/81 (!) 161/92  Pulse: 87 88  Resp: 10 15  Temp:  36.6 C  SpO2: 98% 98%    Last Pain:  Vitals:   04/15/24 1215  TempSrc:   PainSc: 0-No pain                 Eliska Hamil

## 2024-04-15 NOTE — Discharge Instructions (Signed)
 EATING AFTER YOUR ESOPHAGEAL SURGERY (Stomach Fundoplication, Hiatal Hernia repair, Achalasia surgery, etc)  ######################################################################  EAT Start with a pureed / full liquid diet (see below) Gradually transition to a high fiber diet with a fiber supplement over the next month after discharge.    WALK Walk an hour a day.  Control your pain to do that.    CONTROL PAIN Control pain so that you can walk, sleep, tolerate sneezing/coughing, go up/down stairs.  HAVE A BOWEL MOVEMENT DAILY Keep your bowels regular to avoid problems.  OK to try a laxative to override constipation.  OK to use an antidairrheal to slow down diarrhea.  Call if not better after 2 tries  CALL IF YOU HAVE PROBLEMS/CONCERNS Call if you are still struggling despite following these instructions. Call if you have concerns not answered by these instructions  ######################################################################   After your esophageal surgery, expect some sticking with swallowing over the next 1-2 months.    If food sticks when you eat, it is called dysphagia.  This is due to swelling around your esophagus at the wrap & hiatal diaphragm repair.  It will gradually ease off over the next few months.  To help you through this temporary phase, we start you out on a pureed (blenderized) diet.  Your first meal in the hospital was thin liquids.  You should have been given a pureed diet by the time you left the hospital.  We ask patients to stay on a pureed diet for the first 2-3 weeks to avoid anything getting stuck near your recent surgery.  Don't be alarmed if your ability to swallow doesn't progress according to this plan.  Everyone is different and some diets can advance more or less quickly.    It is often helpful to crush your medications or split them as they can sometimes stick, especially the first week or so.   Some BASIC RULES to follow are: Maintain  an upright position whenever eating or drinking. Take small bites - just a teaspoon size bite at a time. Eat slowly.  It may also help to eat only one food at a time. Consider nibbling through smaller, more frequent meals & avoid the urge to eat BIG meals Do not push through feelings of fullness, nausea, or bloatedness Do not mix solid foods and liquids in the same mouthful Try not to wash foods down with large gulps of liquids. Avoid carbonated (bubbly/fizzy) drinks.   Avoid foods that make you feel gassy or bloated.  Start with bland foods first.  Wait on trying greasy, fried, or spicy meals until you are tolerating more bland solids well. Understand that it will be hard to burp and belch at first.  This gradually improves with time.  Expect to be more gassy/flatulent/bloated initially.  Walking will help your body manage it better. Consider using medications for bloating that contain simethicone  such as  Maalox or Gas-X  Consider crushing her medications, especially smaller pills.  The ability to swallow pills should get easier after a few weeks Eat in a relaxed atmosphere & minimize distractions. Avoid talking while eating.   Do not use straws. Following each meal, sit in an upright position (90 degree angle) for 60 to 90 minutes.  Going for a short walk can help as well If food does stick, don't panic.  Try to relax and let the food pass on its own.  Sipping WARM LIQUID such as strong hot black tea can also help slide it down.  Be gradual in changes & use common sense:  -If you easily tolerating a certain level of foods, advance to the next level gradually -If you are having trouble swallowing a particular food, then avoid it.   -If food is sticking when you advance your diet, go back to thinner previous diet (the lower LEVEL) for 1-2 days.  LEVEL 1 = PUREED DIET  Do for the first 2 WEEKS AFTER SURGERY  -Foods in this group are pureed or blenderized to a smooth, mashed  potato-like consistency.  -If necessary, the pureed foods can keep their shape with the addition of a thickening agent.   -Meat should be pureed to a smooth, pasty consistency.  Hot broth or gravy may be added to the pureed meat, approximately 1 oz. of liquid per 3 oz. serving of meat. -CAUTION:  If any foods do not puree into a smooth consistency, swallowing will be more difficult.  (For example, nuts or seeds sometimes do not blend well.)  Hot Foods Cold Foods  Pureed scrambled eggs and cheese Pureed cottage cheese  Baby cereals Thickened juices and nectars  Thinned cooked cereals (no lumps) Thickened milk or eggnog  Pureed [place] toast or pancakes Ensure  Mashed potatoes Ice cream  Pureed parsley, au gratin, scalloped potatoes, candied sweet potatoes Fruit or [place] ice, sherbet  Pureed buttered or alfredo noodles Plain yogurt  Pureed vegetables (no corn or peas) Instant breakfast  Pureed soups and creamed soups Smooth pudding, mousse, custard  Pureed scalloped apples Whipped gelatin  Gravies Sugar, syrup, honey, jelly  Sauces, cheese, tomato, barbecue, white, creamed Cream  Any baby food Creamer  Alcohol  in moderation (not beer or champagne) Margarine  Coffee or tea Mayonnaise   Ketchup, mustard   Apple sauce   SAMPLE MENU:  PUREED DIET Breakfast Lunch Dinner  Orange juice, 1/2 cup Cream of wheat, 1/2 cup Pineapple juice, 1/2 cup Pureed [place], barley soup, 3/4 cup Pureed Hawaiian chicken, 3 oz  Scrambled eggs, mashed or blended with cheese, 1/2 cup Tea or coffee, 1 cup  Whole milk, 1 cup  Non-dairy creamer, 2 Tbsp. Mashed potatoes, 1/2 cup Pureed cooled broccoli, 1/2 cup Apple sauce, 1/2 cup Coffee or tea Mashed potatoes, 1/2 cup Pureed spinach, 1/2 cup Frozen yogurt, 1/2 cup Tea or coffee      LEVEL 2 = SOFT DIET  After your first 2 weeks, you can advance to a soft diet.   Keep on this diet until everything goes down easily.  Hot Foods Cold Foods  White fish  Cottage cheese  Stuffed fish Junior baby fruit  Baby food meals Semi thickened juices  Minced soft cooked, scrambled, poached eggs nectars  Souffle & omelets Ripe mashed bananas  Cooked cereals Canned fruit, pineapple sauce, milk  potatoes Milkshake  Buttered or Alfredo noodles Custard  Cooked cooled vegetable Puddings, including tapioca  Sherbet Yogurt  Vegetable soup or alphabet soup Fruit ice, [place] ice  Gravies Whipped gelatin  Sugar, syrup, honey, jelly Junior baby desserts  Sauces:  Cheese, creamed, barbecue, tomato, white Cream  Coffee or tea Margarine   SAMPLE MENU:  LEVEL 2 Breakfast Lunch Dinner  Orange juice, 1/2 cup Oatmeal, 1/2 cup Scrambled eggs with cheese, 1/2 cup Decaffeinated tea, 1 cup Whole milk, 1 cup Non-dairy creamer, 2 Tbsp Pineapple juice, 1/2 cup Minced beef, 3 oz Gravy, 2 Tbsp Mashed potatoes, 1/2 cup Minced fresh broccoli, 1/2 cup Applesauce, 1/2 cup Coffee, 1 cup [place], barley soup, 3/4 cup Minced Hawaiian chicken, 3 oz  Mashed potatoes, 1/2 cup Cooked spinach, 1/2 cup Frozen yogurt, 1/2 cup Non-dairy creamer, 2 Tbsp      LEVEL 3 = CHOPPED DIET  -After all the foods in level 2 (soft diet) are passing through well you should advance up to more chopped foods.  -It is still important to cut these foods into small pieces and eat slowly.  Hot Foods Cold Foods  Poultry Cottage cheese  Chopped Swedish meatballs Yogurt  Meat salads (ground or flaked meat) Milk  Flaked fish (tuna) Milkshakes  Poached or scrambled eggs Soft, cold, dry cereal  Souffles and omelets Fruit juices or nectars  Cooked cereals Chopped canned fruit  Chopped [place] toast or pancakes Canned fruit cocktail  Noodles or pasta (no rice) Pudding, mousse, custard  Cooked vegetables (no frozen peas, corn, or mixed vegetables) Green salad  Canned small sweet peas Ice cream  Creamed soup or vegetable soup Fruit ice, [place] ice  Pureed vegetable soup or alphabet soup  Non-dairy creamer  Ground scalloped apples Margarine  Gravies Mayonnaise  Sauces:  Cheese, creamed, barbecue, tomato, white Ketchup  Coffee or tea Mustard   SAMPLE MENU:  LEVEL 3 Breakfast Lunch Dinner  Orange juice, 1/2 cup Oatmeal, 1/2 cup Scrambled eggs with cheese, 1/2 cup Decaffeinated tea, 1 cup Whole milk, 1 cup Non-dairy creamer, 2 Tbsp Ketchup, 1 Tbsp Margarine, 1 tsp Salt, 1/4 tsp Sugar, 2 tsp Pineapple juice, 1/2 cup Ground beef, 3 oz Gravy, 2 Tbsp Mashed potatoes, 1/2 cup Cooked spinach, 1/2 cup Applesauce, 1/2 cup Decaffeinated coffee Whole milk Non-dairy creamer, 2 Tbsp Margarine, 1 tsp Salt, 1/4 tsp Pureed turkey, barley soup, 3/4 cup Barbecue chicken, 3 oz Mashed potatoes, 1/2 cup Ground fresh broccoli, 1/2 cup Frozen yogurt, 1/2 cup Decaffeinated tea, 1 cup Non-dairy creamer, 2 Tbsp Margarine, 1 tsp Salt, 1/4 tsp Sugar, 1 tsp    LEVEL 4:  REGULAR FOODS  -Foods in this group are soft, moist, regularly textured foods.   -This level includes meat and breads, which tend to be the hardest things to swallow.   -Eat very slowly, chew well and continue to avoid carbonated drinks. -most people are at this level in 4-6 weeks  Hot Foods Cold Foods  Baked fish or skinned Soft cheeses - cottage cheese  Souffles and omelets Cream cheese  Eggs Yogurt  Stuffed shells Milk  Spaghetti with meat sauce Milkshakes  Cooked cereal Cold dry cereals (no nuts, dried fruit, coconut)  [place] toast or pancakes Crackers  Buttered toast Fruit juices or nectars  Noodles or pasta (no rice) Canned fruit  Potatoes (all types) Ripe bananas  Soft, cooked vegetables (no corn, lima, or baked beans) Peeled, ripe, fresh fruit  Creamed soups or vegetable soup Cakes (no nuts, dried fruit, coconut)  Canned chicken noodle soup Plain doughnuts  Gravies Ice cream  Bacon dressing Pudding, mousse, custard  Sauces:  Cheese, creamed, barbecue, tomato, white Fruit ice, [place] ice, sherbet   Decaffeinated tea or coffee Whipped gelatin  Pork chops Regular gelatin   Canned fruited gelatin molds   Sugar, syrup, honey, jam, jelly   Cream   Non-dairy   Margarine   Oil   Mayonnaise   Ketchup   Mustard   TROUBLESHOOTING IRREGULAR BOWELS  1) Avoid extremes of bowel movements (no bad constipation/diarrhea)  2) Miralax 17gm mixed in 8oz. water or juice-daily. May use BID as needed.  3) Gas-x,Phazyme, etc. as needed for gas & bloating.  4) Soft,bland diet. No spicy,greasy,fried foods.  5) Prilosec over-the-counter  as needed  6) May hold gluten/wheat products from diet to see if symptoms improve.  7) May try probiotics (Align, Activa, etc) to help calm the bowels down  7) If symptoms become worse call back immediately.    If you have any questions please call our office at [facility]: [phone].    ################################################################  LAPAROSCOPIC SURGERY: POST OP INSTRUCTIONS  ######################################################################  EAT   Start with a pureed / full liquid diet (see above)  WALK Walk an hour a day.  Control your pain to do that.    CONTROL PAIN Control pain so that you can walk, sleep, tolerate sneezing/coughing, go up/down stairs.  HAVE A BOWEL MOVEMENT DAILY Keep your bowels regular to avoid problems.  OK to try a laxative to override constipation.  OK to use an antidairrheal to slow down diarrhea.  Call if not better after 2 tries  CALL IF YOU HAVE PROBLEMS/CONCERNS Call if you are still struggling despite following these instructions. Call if you have concerns not answered by these instructions  ######################################################################    DIET: See esophageal surgery section above.  Focus on blenderized/pured diet and liquids the first few weeks  Take your usually prescribed home medications unless otherwise directed. Blood thinners:  You can  restart any strong blood thinners after the second postoperative day  for example: COUMADIN (warfarin), XERELTO (rivaroxaban), ELIQUIS (apixaban), PLAVIX (clopidigrel), BRILINTA (ticagrelor), EFFIENT (prasugrel), PRADAXA (dabigatran), etc  Continue aspirin before & after surgery..     Some oozing/bleeding the first 1-2 weeks is common but should taper down & be small volume.    If you are passing many large clots or having uncontrolling bleeding, call your surgeon  PAIN CONTROL: Pain is best controlled by a usual combination of three different methods TOGETHER: Ice/Heat Over the counter pain medication Prescription pain medication Most patients will experience some swelling and bruising around the incisions.  Ice packs or heating pads (30-60 minutes up to 6 times a day) will help. Use ice for the first few days to help decrease swelling and bruising, then switch to heat to help relax tight/sore spots and speed recovery.  Some people prefer to use ice alone, heat alone, alternating between ice & heat.  Experiment to what works for you.  Swelling and bruising can take several weeks to resolve.   It is helpful to take an over-the-counter pain medication regularly for the first few weeks.  Choose one of the following that works best for you: Naproxen (Aleve, etc)  Two 220mg  tabs twice a day Ibuprofen  (Advil , etc) Three 200mg  tabs four times a day (every meal & bedtime) Acetaminophen  (Tylenol , etc) 500-650mg  four times a day (every meal & bedtime) A  prescription for pain medication (such as oxycodone , hydrocodone , tramadol, gabapentin , methocarbamol , etc) should be given to you upon discharge.  Take your pain medication as prescribed.  If you are having problems/concerns with the prescription medicine (does not control pain, nausea, vomiting, rash, itching, etc), please call us  [phone] to see if we need to switch you to a different pain medicine that will work better for you and/or control  your side effect better. If you need a refill on your pain medication, please give us  48 hour notice.  contact your pharmacy.  They will contact our office to request authorization. Prescriptions will not be filled after 5 pm or on week-ends  AVOID GETTING CONSTIPATED.   a.  Between the surgery and the pain medications, it is common to experience some  constipation.  b.  Drink plenty of liquids c   ake a fiber supplement 2 times day (such as Metamucil, Citrucel, FiberCon, MiraLax, etc) to have a bowel movement every day. d.  If you have not had a BM by 2 days after surgery: -drink liquids only until you have a bowel movement - take MiraLAX 2 doses every 2 hours until you have a bowel movement   Watch out for diarrhea.   If you have many loose bowel movements, simplify your diet to bland foods & liquids for a few days.   Stop any stool softeners and decrease your fiber supplement.   Switching to mild anti-diarrheal medications (Kayopectate, Pepto Bismol) can help.   If this worsens or does not improve, please call us .  Wash / shower every day.  You may shower over the dressings as they are waterproof.  Continue to shower over incision(s) after the dressing is off.  It is good for closed incisions and even open wounds to be washed every day.  Shower every day.  Short baths are fine.  Wash the incisions and wounds clean with soap & water.    You may leave closed incisions open to air if it is dry.   You may cover the incision with clean gauze & replace it after your daily shower for comfort.  TEGADERM:  You have clear gauze band-aid dressings over your closed incision(s).  Remove the dressings 2 days after surgery = 8/16.    ACTIVITIES as tolerated:   You may resume regular (light) daily activities beginning the next day--such as daily self-care, walking, climbing stairs--gradually increasing activities as tolerated.  If you can walk 30 minutes without difficulty, it is safe to try more  intense activity such as jogging, treadmill, bicycling, low-impact aerobics, swimming, etc. Save the most intensive and strenuous activity for last such as sit-ups, heavy lifting, contact sports, etc  Refrain from any heavy lifting or straining until you are off narcotics for pain control.   DO NOT PUSH THROUGH PAIN.  Let pain be your guide: If it hurts to do something, don't do it.  Pain is your body warning you to avoid that activity for another week until the pain goes down. You may drive when you are no longer taking prescription pain medication, you can comfortably wear a seatbelt, and you can safely maneuver your car and apply brakes. You may have sexual intercourse when it is comfortable.  FOLLOW UP in our office Please call [facility] at [phone] to set up an appointment to see your surgeon in the office for a follow-up appointment approximately 2-3 weeks after your surgery. Make sure that you call for this appointment the day you arrive home to insure a convenient appointment time.  10. IF YOU HAVE DISABILITY OR FAMILY LEAVE FORMS, BRING THEM TO THE OFFICE FOR PROCESSING.  DO NOT GIVE THEM TO YOUR DOCTOR.   WHEN TO CALL US  ([phone]) [phone]: Poor pain control Reactions / problems with new medications (rash/itching, nausea, etc)  Fever over 101.5 F (38.5 C) Inability to urinate Nausea and/or vomiting Worsening swelling or bruising Continued bleeding from incision. Increased pain, redness, or drainage from the incision   The clinic staff is available to answer your questions during regular business hours (8:30am-5pm).  Please don't hesitate to call and ask to speak to one of our nurses for clinical concerns.   If you have a medical emergency, go to the nearest emergency room or call 911.  A surgeon  from [facility] is always on call at the [facility], [place] [address], [place], [place]  [address] ? MAIN: [phone] ? TOLL  FREE: [phone] ?  FAX [phone] [email or web address]  ##############################################################

## 2024-04-15 NOTE — Interval H&P Note (Signed)
 History and Physical Interval Note:  04/15/2024 7:02 AM  [name]  has presented today for surgery, with the diagnosis of RECURRENT PARAESOPHAGEAL HIATAL HERNIA.  The various methods of treatment have been discussed with the patient and family. After consideration of risks, benefits and other options for treatment, the patient has consented to  Procedure(s) with comments: REPAIR, HERNIA, PARAESOPHAGEAL, ROBOT-ASSISTED (N/A) - ROBOTIC PARAESOPHAGEAL HIATAL HERNIA WITH FUNDOPLICATION as a surgical intervention.  The patient's history has been reviewed, patient examined, no change in status, stable for surgery.  I have reviewed the patient's chart and labs.  Questions were answered to the patient's satisfaction.    I have re-reviewed the the patient's records, history, medications, and allergies.  I have re-examined the patient.  I again discussed intraoperative plans and goals of post-operative recovery.  The patient agrees to proceed.  [name]  1966/03/06 [id number]  [facility] Care Team: [facility] as PCP - General (General Practice)  Patient Active Problem List   Diagnosis Date Noted   OAB (overactive bladder) 04/15/2024   Rheumatoid arthritis with rheumatoid factor of multiple sites without organ or systems involvement (HCC) 04/15/2024   Inflammatory polyarthropathy (HCC) 04/15/2024   Allergic rhinitis 04/15/2024   ? of pelvic endometrisosis 12/30/2011   Hypertension 12/30/2011   Hyperlipidemia 12/30/2011   Family history of colon cancer 12/30/2011    Past Medical History:  Diagnosis Date   Anemia    Anxiety    Arthritis    Depression    Endometriosis    Question of   Fibroids    GERD (gastroesophageal reflux disease)    Status post Nissen fundoplication   Hemorrhoids    History of hiatal hernia    Hyperlipemia    Hypertension    Sleep apnea     Past Surgical History:  Procedure Laterality Date   ABDOMINAL HYSTERECTOMY  10/17/2011   Procedure:  HYSTERECTOMY ABDOMINAL;  Surgeon: Debby JULIANNA Lares, MD;  Location: [facility];  Service: Gynecology;  Laterality: N/A;   BREAST SURGERY  2007   biopsy   CESAREAN SECTION  2006   CHOLECYSTECTOMY     COLONOSCOPY  09/08/2008   Hemorrhoids   HERNIA REPAIR  1974   MYOMECTOMY  2002   NISSEN FUNDOPLICATION  2002   Dr. Gladis   SALPINGOOPHORECTOMY  10/17/2011   Procedure: SALPINGO OOPHERECTOMY;  Surgeon: Debby JULIANNA Lares, MD;  Location: [facility];  Service: Gynecology;  Laterality: Bilateral;  possible ablation of pelvic  endometriosis    Social History   Socioeconomic History   Marital status: Married    Spouse name: Not on file   Number of children: 1   Years of education: Not on file   Highest education level: Not on file  Occupational History   Occupation: Diplomatic Services operational officer: [employer]  Tobacco Use   Smoking status: Never   Smokeless tobacco: Never  Vaping Use   Vaping status: Never Used  Substance and Sexual Activity   Alcohol  use: Yes    Comment: occasionally   Drug use: No   Sexual activity: Not on file  Other Topics Concern   Not on file  Social History Narrative   Daily caffeine    Social Drivers of Health   Financial Resource Strain: Not on file  Food Insecurity: Not on file  Transportation Needs: Not on file  Physical Activity: Not on file  Stress: Not on file  Social Connections: Unknown (01/11/2022)   Received from [facility]  Social Network    Social Network: Not on file  Intimate Partner Violence: Unknown (12/03/2021)   Received from [facility]   HITS    Physically Hurt: Not on file    Insult or Talk Down To: Not on file    Threaten Physical Harm: Not on file    Scream or Curse: Not on file    Family History  Problem Relation Age of Onset   Colon cancer Father 57   Breast cancer Mother     Medications Prior to Admission  Medication Sig Dispense Refill Last Dose/Taking   acetaminophen  (TYLENOL ) 500 MG tablet Take 1,000 mg by mouth  every 6 (six) hours as needed for moderate pain (pain score 4-6) or mild pain (pain score 1-3).   Past Month   albuterol  (PROVENTIL  HFA;VENTOLIN  HFA) 108 (90 BASE) MCG/ACT inhaler Inhale 3-4 puffs into the lungs every 6 (six) hours as needed for wheezing or shortness of breath.    Past Month   alum & mag hydroxide-simeth (ALMACONE DOUBLE STRENGTH) 400-400-40 MG/5ML suspension 15 mLs every 6 (six) hours as needed for indigestion.   Past Month   busPIRone  (BUSPAR ) 15 MG tablet Take 15 mg by mouth daily as needed (anxiety).   Past Week   docusate sodium (COLACE) 100 MG capsule Take 100 mg by mouth daily as needed for mild constipation.   Past Month   EPINEPHrine  0.3 mg/0.3 mL IJ SOAJ injection Inject 0.3 mg into the muscle as needed for anaphylaxis.   Taking As Needed   estradiol (ESTRACE) 1 MG tablet Take 1 mg by mouth daily as needed (if not using patch).   Taking As Needed   estradiol (VIVELLE-DOT) 0.05 MG/24HR patch Place 1 patch onto the skin 2 (two) times a week.   04/15/2024 Morning   fexofenadine (ALLEGRA) 180 MG tablet Take 180 mg by mouth daily.   Past Month   lisinopril -hydrochlorothiazide  (PRINZIDE ,ZESTORETIC ) 20-12.5 MG per tablet Take 1 tablet by mouth daily as needed (High blood pressure).   Past Month   montelukast  (SINGULAIR ) 10 MG tablet Take 10 mg by mouth at bedtime.   Taking   oxybutynin  (DITROPAN ) 5 MG tablet Take 10 mg by mouth 2 (two) times daily.   Past Month   pantoprazole  (PROTONIX ) 40 MG tablet Take 40 mg by mouth daily. 30 min before meal   Past Week   predniSONE (DELTASONE) 5 MG tablet Take 5 mg by mouth daily as needed (Arthritis).   Past Week   tirzepatide (ZEPBOUND) 2.5 MG/0.5ML Pen Inject 2.5 mg into the skin once a week.   Taking   traZODone  (DESYREL ) 50 MG tablet Take 25 mg by mouth at bedtime as needed for sleep.   Past Month   venlafaxine  (EFFEXOR ) 75 MG tablet Take 75 mg by mouth daily.   Past Month   atorvastatin  (LIPITOR) 10 MG tablet Take 10 mg by mouth at  bedtime.   More than a month   beclomethasone (QVAR) 80 MCG/ACT inhaler Inhale 1 puff into the lungs daily.   More than a month   budesonide  (RHINOCORT  AQUA) 32 MCG/ACT nasal spray Place 1 spray into the nose daily as needed (dry nose).   More than a month   carboxymethylcellulose 1 % ophthalmic solution Place 1 drop into both eyes daily as needed (Dry eyes).   More than a month   Cholecalciferol (VITAMIN D3 PO) Take 1 tablet by mouth daily.   More than a month    Current Facility-Administered Medications  Medication  Dose Route Frequency Provider Last Rate Last Admin   lactated ringers  infusion   Intravenous Continuous Lucious Debby BRAVO, MD 10 mL/hr at 04/15/24 9385 New Bag at 04/15/24 0614     Allergies  Allergen Reactions   Iodinated Contrast Media Shortness Of Breath   Lisinopril  Cough   Mometasone Other (See Comments)   Mometasone Furoate     Other Reaction(s): Hypersensitivity finding   Morphine  Itching   Shellfish Allergy     BP (!) 156/89 (BP Location: Left Arm)   Pulse 75   Temp 97.9 F (36.6 C) (Oral)   Resp 16   Ht 5' 5 (1.651 m)   Wt 89.4 kg   LMP 10/08/2011   SpO2 99%   BMI 32.78 kg/m   Labs: No results found for this or any previous visit (from the past 48 hours).  Imaging / Studies: NM GASTRIC EMPTYING Result Date: 03/26/2024 CLINICAL DATA:  Helicobacter pylori gastritis. Irritable bowel syndrome with diarrhea and constipation. EXAM: NUCLEAR MEDICINE GASTRIC EMPTYING SCAN TECHNIQUE: After oral ingestion of radiolabeled meal, sequential abdominal images were obtained for 4 hours. Percentage of activity emptying the stomach was calculated at 1 hour, 2 hour, 3 hour, and 4 hours. RADIOPHARMACEUTICALS:  2.0 mCi Tc-63m sulfur  colloid in standardized meal COMPARISON:  None Available. FINDINGS: Expected location of the stomach in the left upper quadrant. Ingested meal empties the stomach rapidly over the course of the study. 72% emptied at 1 hr ( normal >= 10%) 96%  emptied at 2 hr ( normal >= 40%) 100% emptied at 3 hr ( normal >= 70%) 100% emptied at 4 hr ( normal >= 90%) IMPRESSION: Normal gastric emptying study. Electronically Signed   By: Norleen DELENA Kil M.D.   On: 03/26/2024 12:49   DG UGI W DOUBLE CM (HD BA) Result Date: 03/22/2024 CLINICAL DATA:  Hx of Nissen fundoplication, return/worsening of GERD symptoms. EXAM: UPPER GI SERIES WITH HIGH DENSITY WITHOUT KUB TECHNIQUE: Combined double and single contrast examination was performed using effervescent crystals, high-density barium and thin liquid barium. This exam was performed by Kimble Clas, PA-C, and was supervised and interpreted by Dr. Karalee. FLUOROSCOPY: Radiation Exposure Index (as provided by the fluoroscopic device): 33.50 mGy Kerma COMPARISON:  None Available. FINDINGS: Esophagus: Thin band like indentation of the anterior esophagus in the lower cervical esophagus suggestive of an esophageal web. Otherwise, no mass, stenosis or stricture. Esophageal motility: Within normal limits. Gastroesophageal reflux: None visualized. Ingested 13 mm barium tablet: Home briefly at the distal cervical esophagus but then passed. Stomach: No evidence of recurrent hiatal hernia. Surgical changes of prior Nissen fundoplication Gastric emptying: Normal. Duodenum: Normal appearance. Other:  None. IMPRESSION: 1. 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 2. No evidence of recurrent hiatal hernia. Prior Nissen fundoplication appears intact. 3. No reflux visualized during the examination. Electronically Signed   By: Wilkie Karalee M.D.   On: 03/22/2024 10:16     .[name], M.D., F.A.C.S. Gastrointestinal and Minimally Invasive Surgery [facility] [address], [address] [place], [place] [address] [phone] Main / Paging  04/15/2024 7:03 AM    [name]

## 2024-04-15 NOTE — Transfer of Care (Signed)
 Immediate Anesthesia Transfer of Care Note  Patient: [name]  Procedure(s) Performed: REPAIR, HERNIA, PARAESOPHAGEAL, ROBOT-ASSISTED FUNDOPLICATION, NISSEN, ROBOT-ASSISTED, LAPAROSCOPIC  Patient Location: PACU  Anesthesia Type:General  Level of Consciousness: drowsy and patient cooperative  Airway & Oxygen Therapy: Patient Spontanous Breathing  Post-op Assessment: Report given to RN and Post -op Vital signs reviewed and stable  Post vital signs: Reviewed and stable  Last Vitals:  Vitals Value Taken Time  BP 149/74 04/15/24 10:47  Temp    Pulse 83 04/15/24 10:49  Resp 14 04/15/24 10:49  SpO2 100 % 04/15/24 10:49  Vitals shown include unfiled device data.  Last Pain:  Vitals:   04/15/24 0602  TempSrc:   PainSc: 5          Complications: No notable events documented.

## 2024-04-15 NOTE — Progress Notes (Signed)
 Pt does not want to use the machine tonight. Pt states she may use it tomorrow. Will continue to monitor and treat.

## 2024-04-15 NOTE — Op Note (Signed)
 04/15/2024  10:36 AM  PATIENT:  [name]  58 y.o. 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 ROBOTIC lysis of adhesions x 95 minutes (67% case) 2. Excision of esophageal lipoma 3. Type II mediastinal dissection. 4. Takedown of Slipped Fundoplication with redo Nissen (360 degree x 2cm over a Bougie)  fundoplication 5. Primary repair of hiatal hernia over pledgets.  6. Anterior & posterior gastropexy. 7. Mesh reinforcement with absorbable mesh  SURGEON:  Standing KYM Sheldon, MD  ASSISTANT:  Bernarda Ned, MD  An experienced assistant was required given the standard of surgical care given the complexity of the case.  This assistant was needed for exposure, dissection, suction, tissue approximation, retraction, perception, etc  ANESTHESIA:  General endotracheal intubation anesthesia (GETA) and Local & regional field block at incision(s) for perioperative & postoperative pain control provided with 60mL of bupivicaine 0.25% with epinephrine   Estimated Blood Loss (EBL):   Total I/O In: 1000 [I.V.:1000] Out: 25 [Blood:25].   (See anesthesia record)  Delay start of Pharmacological VTE agent (>24hrs) due to concerns of significant anemia, surgical blood loss, or risk of bleeding?:  no  DRAINS: (None) and 19 Fr Blake drain with tip resting in the mediastinum  SPECIMEN: Distal esophageal lipoma.  Anterior and posterior epiphrenic fat pads  DISPOSITION OF SPECIMEN:  Pathology  COUNTS:  Sponge, needle, & instrument counts CORRECT at the conclusion of the case.      PLAN OF CARE: Admit to inpatient   PATIENT DISPOSITION:  PACU - hemodynamically  stable.  INDICATION:   Pleasant patient with gastro for reflux disease with poor medical tolerance.  Underwent Nissen fundoplication over 56 French dilator by Dr. Gladis in 2002.  Had good result with that but began to have worsening symptoms.  EGD at [facility] raise concern of slip fundoplication and small hiatal hernia.  Surgical consultation requested.  The patient has had extensive work-up & we feel the patient will benefit from repair:  The anatomy & physiology of the foregut and anti-reflux mechanism was discussed.  The pathophysiology of hiatal herniation and GERD was discussed.  Natural history risks without surgery was discussed.   The patient's symptoms are not adequately controlled by medicines and other non-operative treatments.  I feel the risks of no intervention will lead to serious problems that outweigh the operative risks; therefore, I recommended surgery to reduce the hiatal hernia out of the chest and fundoplication to rebuild the anti-reflux valve and control reflux better.  Need for a thorough workup to rule out the differential diagnosis and plan treatment was explained.  I explained laparoscopic techniques with possible need for an open approach.  Risks such as bleeding, infection, abscess, leak, need for further treatment, heart attack, death, and other risks were discussed.   I noted a good likelihood this will help address the problem.  Goals of post-operative recovery were discussed as well.  Possibility that this will not correct all symptoms was explained.  Post-operative dysphagia, need for short-term liquid & pureed diet, inability to vomit, possibility of reherniation, possible need for medicines to help control symptoms in addition to surgery were discussed.  We will work to minimize complications.   Educational handouts further explaining the pathology, treatment options, and  dysphagia diet was given as well.  Questions were answered.  The patient expresses  understanding & wishes to proceed with surgery.  OR FINDINGS:   Small sliding paraesophageal hiatal hernia with 5% of the stomach in the mediastinum.  There was a 6 x 5 cm hiatal defect.  Required left crural release.  Very dense adhesions of the caudate liver lobe to right crus send not able to get as much of release there.    It is a primary repair over pledgets.  Mesh reinforcement was used with GORE BIO-A mesh, a biosynthetic web scaffold made of 67% polyglycolic acid (PGA): 33% trimethylene carbonate (TMC).  Patient had anterior disruption of the fundoplication especially on the right side with a resulting 180 degree fundoplication twisted counterclockwise and slipped down on the cardia and not on the true esophagus.  The patient has a Nissen (360 degree x 2cm over a 56Fr Maloney dilator Bougie)  fundoplication.  The patient has had anterior and posterior gastropexy.  DESCRIPTION:   Informed consent was confirmed.  The patient received IV antibiotics prior to incision.  The underwent general anesthesia without difficulty.  A Foley catheter sterilely placed.  The patient was positioned in split leg with arms tucked. The abdomen was prepped and draped in the sterile fashion.  Surgical time-out confirmed our plan.  Varess entry technique was performed on the left subcostal ridge with the patient in steep reverse Trendelenburg and left side up.  Entry was clean.  We induced carbon dioxide insufflation.  Camera inspection revealed no injury.  Under direct visualization, I placed extra ports.  I also placed a 5 mm port in the left subxiphoid region under direct visualization.  I removed that and placed an Omega-shaped rigid Nathanson liver retractor to lift the left lateral sector of the liver anteriorly to expose the esophageal hiatus.  It could only be partially elevated given the dense adhesions from prior fundoplication between the left lateral sector and anterior stomach this was secured to the bed  using the iron man system.  The Xi robot was carefully docked and instruments placed and advanced under direct visualization.  We focused on dissection.  Worked to meticulously free off the adhesions from the left lateral sector to the stomach especially along the lesser curvature coming up to the hiatus.  Confirmed a small sliding hiatal hernia.  Stomach was somewhat twisted in a counterclockwise fashion more than expected.  I focused on freeing the gastric adhesions to the retroperitoneum on the left side as well as the left crus to help identify the left crus.  Freed off moderate omental adhesions.  Freed the spleen off the left crus and diaphragm as well to help release it and get it out of the way to better identify the anatomy.  I then meticulously skeletonized and free off adhesions between the stomach and esophagus to the right crus, anterior crural apex, left crus.  Then freed off the retroperitoneum.  With this I could place the stomach on axial tension. I did careful mediastinal dissection to free mediastinal adhesions to the esophagogastric region and esophagus.  Encountered a fatty lipoma on the distal esophagus on the right posterior aspect.  Meticulously skeletonized this off the right posterior vagus nerve and transected that off for better visualization.   Did circumferential mediastinal dissection up to 15 cm.  With that got good relaxation and 5-6 cm of esophagus intra-abdominally  I then focused on investigating the fundoplication.  It was apparent that they right side  of the wrap had pulled away and it slipped down posteriorly.  Maybe 180 degrees only intact.  Looks like the fundoplication was sitting more in the cardia not the true esophagus either.  Carefully skeletonized and came through some old tiny not clip and stitches to more fully released the fundoplication off the cardia.  Apparent it slipped down.  Carefully freed off the stomach off the cardia circumferentially to completely  take down the fundoplication.  Patient still had anterior and posterior epiphrenic pads that I carefully skeletonized and transected off to better identify the anatomy.  The investigation and saw no evidence of any esophageal or gastric injury.  Confirmed I had 5-6 cm of esophagus intra-abdominally off tension.  It was not a giant hiatal hernia but there was a defect that would require some closure.  I worked to free the caudate lobe and liver off of the right crus but planes were very dense and concrete.  I went a more distally and found a cleaner plane to help free most of it off to ahead identify 4 cm of right crus.  Not enough to do a good release.  Focused on the left crus and diaphragm.  That was freed off well and spleen on the way.  I did a left crural release 6 cm lateral to the medial part of the crus in the sagittal plane and got several centimeters of relaxation so the curve could come together much more easily off tension.  Left crus had good substance.  I brought the fundus of the stomach posterior to the esophagus over to the right side.  The wrap was mobile with the classic shoe shine maneuver.  Wrap became together gently.  We reflected the stomach left laterally and closed the esophageal hiatus using #1 Ethibond stitch using horizontal mattress stitches with pledgets on both sides.  I did that x1 stitch.  I then did a second #1 Ethibond horizontal mattress suture to close the hiatal hernia at the most superior junction without pledgets.  The crura had good substance and they came together well without any tension.  Because of the redo repair with the right crus being of fair quality, I reinforced the repair using a Bio-A 10x7 cm biosynthetic precut mesh. We brought the mesh in and laid it over the crural repair, tails anterior over the crura.  I tucked the broader U tail of the mesh between the left diaphragm and the spleen, the narrower U tail over the right crus.  I secured to the left  lateral and left superior sides of the broader U tail to the left diaphragm band with 2-0 Strattfix running suture.  Secured the narrower U tail to the right crus using a running 2-0 V-lock suture as well  I brought the fundus of the stomach behind the esophagus and cardia to set up a fundoplication wrap.  I did a posterior gastropexy x3  by taking of  0-Ethibond interrupted stitches to the posterior part of the right side of the wrap and thru the mesh and crural closure.  That way the stomach covered the mesh and protected it from any esophageal exposure.  With the anterior and posterior gastropexies, stomach laid well for a fundoplication wrap.  Given the fact the patient wished to be aggressive in getting off medications and had good response to a prior fundoplication with normal esophageal function by manometry, I decided to proceed with a redo Nissen fundoplication.  Anesthesia passed a 54 Fr Maloney dilator bougie  transorally with myself holding the esophagus & proximal stomach robotically under axial tension.  The bougue passed down easily without resistance.    I then did a classic 2 cm Nissen fundoplication on the true esophagus above the cardia using Ethibond stitch in the left side of the wrap, then anterior esophagus, and then right side of the wrap and tied that down. Did 3 stitches.  I measured it and it was 2 cm in length.  We removed the bougie.  It was intact.  I did do a left anterior gastropexy of the left lateral fundoplication to the left crus well.  Now allow the fundoplication to avoid any twisting or torsion and laid well.  The wrap was soft and floppy.  I placed a drain as noted above.  I saw no evidence of any leak or perforation or other abnormality.  I removed the [employer] liver retractor under direct visualization.  I evacuated carbon dioxide and removed the ports.  The skin was closed with Monocryl and sterile dressings applied.  The patient is being extubated and brought  back to the recovery room.  I discussed postop care in detail with the patient and family in in the office.  Discussed again with the patient in the holding area.  I discussed operative findings, updated the patient's status, discussed probable steps to recovery, and gave postoperative recommendations to the patient's significant other, [name].  Recommendations were made.  Questions were answered.  He expressed understanding & appreciation.   Elspeth KYM Schultze, M.D., F.A.C.S. Gastrointestinal and Minimally Invasive Surgery [facility] [address] [place], [place] [address] [phone] Main / Paging

## 2024-04-15 NOTE — Plan of Care (Signed)

## 2024-04-15 NOTE — Anesthesia Procedure Notes (Signed)
 Procedure Name: Intubation Date/Time: 04/15/2024 7:44 AM  Performed by: Nanci Riis, CRNAPre-anesthesia Checklist: Patient identified, Emergency Drugs available, Suction available, Patient being monitored and Timeout performed Patient Re-evaluated:Patient Re-evaluated prior to induction Oxygen Delivery Method: Circle system utilized Preoxygenation: Pre-oxygenation with 100% oxygen Induction Type: IV induction Ventilation: Mask ventilation without difficulty Laryngoscope Size: Miller and 3 Grade View: Grade I Tube type: Oral Number of attempts: 1 Airway Equipment and Method: Stylet Placement Confirmation: ETT inserted through vocal cords under direct vision, positive ETCO2 and breath sounds checked- equal and bilateral Secured at: 21 cm Tube secured with: Tape Dental Injury: Teeth and Oropharynx as per pre-operative assessment

## 2024-04-15 NOTE — H&P (Signed)
 04/15/2024   PATIENT NAME: [name] MRN: [id number] DOB: May 11, 1966 PHYSICIANS:  REFERRING PHYSICIAN: [facility]  CARE TEAM:  Patient Care Team: [facility] as PCP - General  CONSULTING PROVIDER: ELSPETH JUDAH SCHULTZE, MD  SUBJECTIVE   Chief Complaint: New Consultation (Congenital hiatus hernia)   [name] is a 58 y.o. female  who is seen today as an office consultation  at [facility] request of [facility]. 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 History of heartburn reflux status post Nissen fundoplication over 56 dilator with 2 suture close of the diaphragm and cholecystectomy by Dr. Adina Lunger with our group 07/24/2001. Patient gets most their care through the [facility] health system. Found to have worsening heartburn and reflux. On acid reduction medical therapy. Had EGD which showed slipped fundoplication and hiatal hernia. Surgical consultation requested to see if she needs repeat surgery repair  Patient notes she had many years of relief after the fundoplication surgery and got off her medications. She has gained a little bit of weight and has some borderline prediabetes but otherwise have been trying to stay active. She claims to move her bowels twice a day with the help of MiraLAX and the stool softener. She can walk half hour without difficulty. She does have sleep apnea but uses a mask and that works well. She is starting on a GLP-1 inhibitor in the hope of getting her prediabetes under control and also help with weight loss.  Her main complaint is that she has worsening heartburn and reflux. She cannot sleep lying down. Sometimes reflux when she bends over. Some burping and belching. Not as much. Tends to have hyper flatulence at the Nissen. She is starting her H. pylori medication treatments but feels like the heartburn reflux is not under control. She rarely  drinks alcohol  maybe once every few months. She denies any dysphagia to solids or liquids. Denies much in the way of early satiety but does get some bloating. No hematemesis or hematochezia. She no longer has her gallbladder. She does note her bowels are somewhat irregular and she alternates being constipation and diarrhea. Trying to use the MiraLAX to avoid extremes. She does not like being on the antiacid medicines and she is gone up to twice a day without relief.  Medical History:  Past Medical History:  Diagnosis Date  Anemia  Anxiety  Arthritis  Asthma, unspecified asthma severity, unspecified whether complicated, unspecified whether persistent (HHS-HCC)  Diabetes mellitus without complication (CMS/HHS-HCC)  GERD (gastroesophageal reflux disease)  Hyperlipidemia  Hypertension  Sleep apnea   Patient Active Problem List  Diagnosis  History of Nissen fundoplication  Esophagitis  Helicobacter pylori gastritis  Irritable bowel syndrome with both constipation and diarrhea  OSA on CPAP  Hx laparoscopic cholecystectomy  Slipped Nissen fundoplication  Heartburn   Past Surgical History:  Procedure Laterality Date  GERD Surgery  HERNIA REPAIR  HYSTERECTOMY  JOINT REPLACEMENT    Allergies  Allergen Reactions  Shellfish Derived Anaphylaxis  Iodinated Contrast Media Other (See Comments)  Lisinopril  Cough  Mometasone Other (See Comments)   Current Outpatient Medications on File Prior to Visit  Medication Sig Dispense Refill  aluminum & magnesium  hydroxide-simethicone , DUH/DRH, (MYLANTA MAXIMUM) 400-400-40 mg/5 mL suspension Take by mouth  atorvastatin  (LIPITOR) 10 MG tablet Take 10 mg by mouth at bedtime  beclomethasone dipropionate (QVAR REDIHALER HFA) 80 mcg/actuation inhaler Inhale into the lungs  budesonide  (  RHINOCORT  AQUA) 32 mcg/actuation nasal spray Place into one nostril  busPIRone  (BUSPAR ) 15 MG tablet Take 15 mg by mouth  EPINEPHrine  (EPIPEN ) 0.3 mg/0.3 mL  auto-injector Inject into the muscle  estradiol (DOTTI) patch 0.05 mg/24 hr APPLY 1 PATCH TRANSDERMALLY TWICE A WEEK  montelukast  (SINGULAIR ) 10 mg tablet Take 10 mg by mouth once daily  oxyBUTYnin  (DITROPAN ) 5 mg tablet Take 10 mg by mouth  pantoprazole  (PROTONIX ) 40 MG DR tablet Take 40 mg by mouth  venlafaxine  (EFFEXOR -XR) 75 MG XR capsule Take 75 mg by mouth   No current facility-administered medications on file prior to visit.   Family History  Problem Relation Age of Onset  Breast cancer Mother  Skin cancer Father  High blood pressure (Hypertension) Father  Hyperlipidemia (Elevated cholesterol) Father  Coronary Artery Disease (Blocked arteries around heart) Father  Diabetes Father  Colon cancer Father    Social History   Tobacco Use  Smoking Status Never  Smokeless Tobacco Never    Social History   Socioeconomic History  Marital status: Married  Tobacco Use  Smoking status: Never  Smokeless tobacco: Never  Vaping Use  Vaping status: Never Used  Substance and Sexual Activity  Alcohol  use: Yes  Alcohol /week: 0.0 - 1.0 standard drinks of alcohol   Drug use: Never   Social Drivers of Health   Received from [employer]  Social Network  Housing Stability: Unknown (03/09/2024)  Housing Stability Vital Sign  Homeless in the Last Year: No   ############################################################  Review of Systems: A complete review of systems (ROS) was obtained from the patient.  We have reviewed this information and discussed as appropriate with the patient.  See HPI as well for other pertinent ROS.  Constitutional: No fevers, chills, sweats. Weight stable Eyes: No vision changes, No discharge HENT: No sore throats, nasal drainage Lymph: No neck swelling, No bruising easily Pulmonary: No cough, productive sputum CV: No orthopnea, PND . No exertional chest/neck/shoulder/arm pain. Patient can walk 30 minutes without difficulty.   GI: No personal nor  family history of GI/colon cancer, inflammatory bowel disease, allergy such as Celiac Sprue, dietary/dairy problems, colitis, ulcers nor gastritis. No recent sick contacts/gastroenteritis. No travel outside the country. No changes in diet.  Renal: No UTIs, No hematuria Genital: No drainage, bleeding, masses Musculoskeletal: No severe joint pain. Good ROM major joints Skin: No sores or lesions Heme/Lymph: No easy bleeding. No swollen lymph nodes Neuro: No active seizures. No facial droop Psych: No hallucinations. No agitation  OBJECTIVE   Vitals:  03/09/24 0921 03/09/24 0922  BP: (!) 152/91  Pulse: 70  Temp: 36.6 C (97.9 F)  Weight: 90.4 kg (199 lb 3.2 oz)  Height: 163.8 cm (5' 4.5)  PainSc: 0-No pain  PainLoc: Abdomen   Body mass index is 33.66 kg/m.  PHYSICAL EXAM:  Constitutional: Not cachectic. Hygeine adequate. Vitals signs as above.  Eyes: No glasses. Vision adequate,Pupils reactive, normal extraocular movements. Sclera nonicteric Neuro: CN II-XII intact. No major focal sensory defects. No major motor deficits. Lymph: No head/neck/groin lymphadenopathy Psych: No severe agitation. No severe anxiety. Judgment & insight Adequate, Oriented x4, HENT: Normocephalic, Mucus membranes moist. No thrush. Hearing: adequate Neck: Supple, No tracheal deviation. No obvious thyromegaly Chest: No pain to chest wall compression. Good respiratory excursion. No audible wheezing CV: Pulses intact. regular. No major extremity edema Ext: No obvious deformity or contracture. Edema: Not present. No cyanosis Skin: No major subcutaneous nodules. Warm and dry Musculoskeletal: Severe joint rigidity not present. No obvious clubbing. No  digital petechiae. Mobility: no assist device moving easily without restrictions  Abdomen: Obese Soft. Nondistended. Nontender. Hernia: Not present. Diastasis recti: Not present. No hepatomegaly. No splenomegaly.  Genital/Pelvic: Inguinal hernia: Not present.  Inguinal lymph nodes: without lymphadenopathy nor hidradenitis.   Rectal: (Deferred)    ###################################################################  Labs, Imaging and Diagnostic Testing:  Located in 'Care Everywhere' section of Epic EMR chart  PRIOR [facility] CLINIC NOTES:  Located in 'Care Everywhere' section of Epic EMR chart  SURGERY NOTES:  Located in 'Care Everywhere' section of Epic EMR chart  PATHOLOGY:  Located in 'Care Everywhere' section of Epic EMR chart  Assessment and Plan:  DIAGNOSES:  Diagnoses and all orders for this visit:  Slipped Nissen fundoplication  Helicobacter pylori gastritis  Irritable bowel syndrome with both constipation and diarrhea  OSA on CPAP  Hx laparoscopic cholecystectomy  Esophagitis  Heartburn    ASSESSMENT/PLAN  Pleasant woman with history of laparoscopic Nissen fundoplication by Dr. Gladis in 2002 with 2 stitch repair. Over 56 French bougie. Evidence of worsening heartburn and reflux despite PPI therapy twice daily with recent endoscopy showing esophagitis slipped Nissen wrap/disruption and H. pylori gastritis.  Complete H. pylori gastritis quadruple therapy.  Gastric emptying study to make sure she does not have any delayed gastric emptying that has made her wrap slip or be causing problems. If profound, may need concurrent pyloroplasty. If mild or moderate, hopefully redo fundoplication will be adequate to deal with that.  See if we can get a upper GI with Gastrografin (has problems with shellfish) to get a sense of the anatomy.  Preoperative manometry to see how well her esophageal function is.  She could benefit from robotic lysis of adhesions with takedown and redo of wrap and possible hiatal hernia repair with absorbable mesh. She does have some obesity but has been intentionally losing weight & started a GLP inhibitor. She had good relief with the last fundoplication and really wants to consider surgery again if  that will help control her symptoms and get her off the heartburn meds. She felt like she got many years of heartburn free symptoms with the fundoplication and wishes to try that again. Hates taking the antiacid medicines and all the other medications. Risk of recurrence is higher with her BMI of 33 but she is intentionally losing weight with a GLP inhibitor. She has no interest in weight loss surgery or Roux-en-Y bypass to manage both issues.  The anatomy & physiology of the foregut and anti-reflux mechanism was discussed. The pathophysiology of hiatal herniation and GERD was discussed. Natural history risks without surgery was discussed. The patient's symptoms are not adequately controlled by medicines and other non-operative treatments. I feel the risks of no intervention will lead to serious problems that outweigh the operative risks; therefore, I recommended surgery to reduce the hiatal hernia out of the chest and fundoplication to rebuild the anti-reflux valve and control reflux better. Need for a thorough workup to rule out the differential diagnosis and plan treatment was explained. I explained minimally invasive techniques with possible need for an open approach.  Risks such as bleeding, infection, abscess, leak,injury to other organs, need for repair of tissues / organs, need for further treatment, stroke, heart attack, death, and other risks were discussed. I noted a good likelihood this will help address the problem. Goals of post-operative recovery were discussed as well. Possibility that this will not correct all symptoms was explained. Post-operative dysphagia, need for short-term liquid & pureed diet, inability to vomit, possibility  of reherniation, possible need for medicines to help control symptoms in addition to surgery were discussed. Possibility she needs need to stay on antiacid medications indefinitely on top of the surgical repair to control things better. We will work to minimize  complications. Educational handouts further explaining the pathology, treatment options, and dysphagia diet was given as well. Questions were answered. The patient expresses understanding & wishes to proceed with surgery.  Manometry w/o dysmotility GES WNL  Ready for surgery   Elspeth KYM Schultze, MD, FACS, MASCRS Esophageal, Gastrointestinal & Colorectal Surgery Robotic and Minimally Invasive Surgery  [facility] [address] [place], [place] [address] [phone] Fax [phone] Main  CONTACT INFORMATION: Weekday (9AM-5PM): Call [facility] main office at [phone] Weeknight (5PM-9AM) or Weekend/Holiday: Check EPIC Web Links tab & use AMION (password  [email or web address]) for General Surgery [facility] coverage  Please, DO NOT use SecureChat  (it is not reliable communication to reach operating surgeons & will lead to a delay in care).   Epic staff messaging available for outptient concerns needing 1-2 business day response.      04/15/2024

## 2024-04-16 ENCOUNTER — Encounter (HOSPITAL_COMMUNITY): Payer: Self-pay | Admitting: Surgery

## 2024-04-16 ENCOUNTER — Inpatient Hospital Stay (HOSPITAL_COMMUNITY)

## 2024-04-16 LAB — BASIC METABOLIC PANEL WITH GFR
Anion gap: 11 (ref 5–15)
BUN: 8 mg/dL (ref 6–20)
CO2: 24 mmol/L (ref 22–32)
Calcium: 8.9 mg/dL (ref 8.9–10.3)
Chloride: 101 mmol/L (ref 98–111)
Creatinine, Ser: 1.02 mg/dL — ABNORMAL HIGH (ref 0.44–1.00)
GFR, Estimated: >60 mL/min (ref >60)
Glucose, Bld: 97 mg/dL (ref 70–99)
Potassium: 3.5 mmol/L (ref 3.5–5.1)
Sodium: 136 mmol/L (ref 135–145)

## 2024-04-16 LAB — MAGNESIUM: Magnesium: 1.8 mg/dL (ref 1.7–2.4)

## 2024-04-16 LAB — PHOSPHORUS: Phosphorus: 3.6 mg/dL (ref 2.5–4.6)

## 2024-04-16 LAB — CBC
HCT: 36.8 % (ref 36.0–46.0)
Hemoglobin: 11.9 g/dL — ABNORMAL LOW (ref 12.0–15.0)
MCH: 29 pg (ref 26.0–34.0)
MCHC: 32.3 g/dL (ref 30.0–36.0)
MCV: 89.5 fL (ref 80.0–100.0)
Platelets: 268 K/uL (ref 150–400)
RBC: 4.11 MIL/uL (ref 3.87–5.11)
RDW: 13 % (ref 11.5–15.5)
WBC: 10.2 K/uL (ref 4.0–10.5)
nRBC: 0 % (ref 0.0–0.2)

## 2024-04-16 MED ORDER — DEXAMETHASONE SODIUM PHOSPHATE 10 MG/ML IJ SOLN
8.0000 mg | Freq: Two times a day (BID) | INTRAMUSCULAR | Status: DC
  Administered 2024-04-16 – 2024-04-17 (×3): 8 mg via INTRAVENOUS
  Filled 2024-04-16 (×3): qty 1

## 2024-04-16 MED ORDER — ACETAMINOPHEN 500 MG PO TABS
500.0000 mg | ORAL_TABLET | Freq: Four times a day (QID) | ORAL | Status: DC
  Administered 2024-04-16 – 2024-04-17 (×4): 500 mg via ORAL
  Filled 2024-04-16 (×4): qty 1

## 2024-04-16 MED ORDER — METHOCARBAMOL 500 MG PO TABS
500.0000 mg | ORAL_TABLET | Freq: Three times a day (TID) | ORAL | Status: DC
  Administered 2024-04-16 – 2024-04-17 (×4): 500 mg via ORAL
  Filled 2024-04-16 (×4): qty 1

## 2024-04-16 MED ORDER — IOHEXOL 300 MG/ML  SOLN
100.0000 mL | Freq: Once | INTRAMUSCULAR | Status: AC | PRN
  Administered 2024-04-16: 100 mL via ORAL

## 2024-04-16 NOTE — Plan of Care (Signed)
   Problem: Clinical Measurements: Goal: Diagnostic test results will improve Outcome: Progressing

## 2024-04-16 NOTE — Plan of Care (Addendum)
 Nutrition Education Note  RD consulted for nutrition education regarding a pureed diet patient is to follow the next 2-3 weeks s/p gastric fundoplication.  Patient reports her weight has been mostly stable, has lost some weight since starting a GLP-1 but nothing overly significant.  She endorses trying to exercise more recently, usually 3 times per week.  Usually consumes a protein shake (made by a local place) for breakfast and then her biggest meal is lunch which is often a meat and vegetables. May eat a snack for dinner, such as chips or nuts, but not always.  RD provided Pureed Nutrition Therapy handout from the Academy of Nutrition and Dietetics.  Reviewed basic diet restrictions and tips for achieving diet texture. Also went over foods from each food group including those to choose and those to avoid.  Addressed all of patient's questions and patient endorsed understanding of all information. Teach back method used.  Expect good compliance.  Current diet order is DYS 1, patient is consuming approximately 100% of meals at this time. Labs and medications reviewed. No further nutrition interventions warranted at this time. RD contact information provided. If additional nutrition issues arise, please re-consult RD.  Trude Ned RD, LDN Contact via [employer].

## 2024-04-16 NOTE — Progress Notes (Signed)
 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 ROBOTIC lysis of adhesions x 95 minutes (67% case) 2. Excision of esophageal lipoma 3. Type II mediastinal dissection. 4. Takedown of Slipped Fundoplication with redo Nissen (360 degree x 2cm over a Bougie)  fundoplication 5. Primary repair of hiatal hernia over pledgets.  6. Anterior & posterior gastropexy. 7. Mesh reinforcement with absorbable mesh   SURGEON:  Standing KYM Sheldon, MD  OR FINDINGS:   Small sliding paraesophageal hiatal hernia with 5% of the stomach in the mediastinum.  There was a 6 x 5 cm hiatal defect.  Required left crural release.  Very dense adhesions of the caudate liver lobe to right crus send not able to get as much of release there.    It is a primary repair over pledgets.  Mesh reinforcement was used with GORE BIO-A mesh, a biosynthetic web scaffold made of 67% polyglycolic acid (PGA): 33% trimethylene carbonate (TMC).   Patient had anterior disruption of the fundoplication especially on the right side with a resulting 180 degree fundoplication twisted counterclockwise and slipped down on the cardia and not on the true esophagus.  The patient has a Nissen (360 degree x 2cm over a 56Fr Maloney dilator Bougie)  fundoplication.  The patient has had anterior and posterior gastropexy.  Assessment [facility] Stay = 1 days) 1 Day Post-Op    Stable    Plan:  Clear  liquids as tolerated.  Nausea pain control.  Lower Tylenol  dose and add Robaxin .  Encourage use of hydrocodone  Norco since its worked in the past.  Esophagram this morning.  If no leak or obstruction, advance to dysphagia 1 pured diet  Nutrition consult for pureed diet instruction.  She did have a fundoplication 20 years ago but might be helpful just to reinforce things just in case  -monitor electrolytes & replace as needed  Keep K>4, Mg>2, Phos>3  Surgical drain in mediastinum.  Most likely can remove on day of discharge if output low and no evidence of any leak or other concern on esophagram today  -VTE prophylaxis- SCDs.  Anticoagulation prophyllaxis SQ as appropriate  -mobilize as tolerated to help recovery.  Enlist therapies in moderate/high risk patients as appropriate  I updated the patient's status to the patient and nurse  Recommendations were made.  Questions were answered.  They expressed understanding & appreciation.  -Disposition:  Disposition:  The patient is from: Home Anticipate discharge to:  Home Anticipated Date of Discharge is:  August 16,2025   Barriers to discharge:  Pending Clinical improvement (more likely than not) and Need for inpatient procedure/study  Patient currently is NOT MEDICALLY STABLE for discharge from the hospital from a surgery standpoint.      I reviewed nursing notes, last 24 h vitals and pain scores, last 48 h intake and output, last 24 h labs and trends, and last 24 h imaging results.  I have reviewed this patient's available data, including medical history, events of note, test results, etc as  part of my evaluation.   A significant portion of that time was spent in counseling. Care during the described time interval was provided by me.  This care required moderate level of medical decision making.  04/16/2024    Subjective: (Chief complaint)  Patient tolerating liquids without nausea or vomiting.  Some soreness.  Tylenol  not  enough.  Nurse coming in room with stronger pain meds.  Questions about surgery.  Objective:  Vital signs:  Vitals:   04/15/24 2103 04/15/24 2141 04/16/24 0129 04/16/24 0509  BP: (!) 152/78  138/79 135/83  Pulse: (!) 103  (!) 104 95  Resp: 20  18 18   Temp: 98.1 F (36.7 C)  98.2 F (36.8 C) 98.5 F (36.9 C)  TempSrc: Oral  Oral Oral  SpO2: 98% 95% 97% 96%  Weight:      Height:        Last BM Date : 04/13/24  Intake/Output   Yesterday:  08/14 0701 - 08/15 0700 In: 3898.1 [P.O.:1252; I.V.:2646.1] Out: 2635 [Urine:2600; Drains:10; Blood:25] This shift:  No intake/output data recorded.  Bowel function:  Flatus: No  BM:  No  Drain: Serosanguinous   Physical Exam:  General: Pt awake/alert in no acute distress Eyes: PERRL, normal EOM.  Sclera clear.  No icterus Neuro: CN II-XII intact w/o focal sensory/motor deficits. Lymph: No head/neck/groin lymphadenopathy Psych:  No delerium/psychosis/paranoia.  Oriented x 4 HENT: Normocephalic, Mucus membranes moist.  No thrush Neck: Supple, No tracheal deviation.  No obvious thyromegaly Chest: No pain to chest wall compression.  Good respiratory excursion.  No audible wheezing CV:  Pulses intact.  Regular rhythm.  No major extremity edema MS: Normal AROM mjr joints.  No obvious deformity  Abdomen: Soft.  Nondistended.  Mildly tender at incisions only.  No evidence of peritonitis.  No incarcerated hernias.  Ext:   No deformity.  No mjr edema.  No cyanosis Skin: No petechiae / purpurea.  No major sores.  Warm and dry    Results:   Cultures: No results found for this or any previous visit (from the past 720 hours).  Labs: No results found for this or any previous visit (from the past 48 hours).  Imaging / Studies: No results found.  Medications / Allergies: per chart  Antibiotics: Anti-infectives (From admission, onward)    Start     Dose/Rate Route Frequency Ordered Stop   04/15/24 0815  cefTRIAXone   (ROCEPHIN ) 2 g in sodium chloride  0.9 % 100 mL IVPB  Status:  Discontinued        2 g 200 mL/hr over 30 Minutes Intravenous On call to O.R. 04/15/24 0802 04/15/24 1236   04/15/24 0800  cefTRIAXone  (ROCEPHIN ) 2 g in sodium chloride  0.9 % 100 mL IVPB  Status:  Discontinued       Note to Pharmacy: Pharmacy may adjust dosing strength, interval, or rate of medication as needed for optimal therapy for the patient Send with patient on call to the OR.  Anesthesia to complete antibiotic administration <70min prior to incision per [facility].   2 g 200 mL/hr over 30 Minutes Intravenous On call to O.R. 04/15/24 9247 04/15/24 0756   04/15/24 0800  cefTRIAXone  (ROCEPHIN ) 2 g in sodium chloride  0.9 % 100 mL IVPB       Note to Pharmacy: Pharmacy may adjust dosing strength, interval, or rate of medication as needed for optimal therapy for the patient Send with patient on call to the OR.  Anesthesia to complete antibiotic administration <78min prior  to incision per Best Practice.   2 g 200 mL/hr over 30 Minutes Intravenous On call to O.R. 04/15/24 0756 04/15/24 0800   04/15/24 0756  sodium chloride  0.9 % with cefTRIAXone  (ROCEPHIN ) [facility] Med       Note to Pharmacy: Rudolpho Chew R: cabinet override      04/15/24 0756 04/15/24 0800         Note: Portions of this report may have been transcribed using voice recognition software. Every effort was made to ensure accuracy; however, inadvertent computerized transcription errors may be present.   Any transcriptional errors that result from this process are unintentional.    Elspeth KYM Schultze, MD, FACS, MASCRS Esophageal, Gastrointestinal & Colorectal Surgery Robotic and Minimally Invasive Surgery  [facility] [address] [place], [place] [address] [phone] Fax [phone] Main  CONTACT INFORMATION: Weekday (9AM-5PM): Call [facility] main office at [phone] Weeknight (5PM-9AM) or  Weekend/Holiday: Check EPIC Web Links tab & use AMION (password  [email or web address]) for General Surgery [facility] coverage  Please, DO NOT use SecureChat  (it is not reliable communication to reach operating surgeons & will lead to a delay in care).   Epic staff messaging available for outptient concerns needing 1-2 business day response.      04/16/2024  7:02 AM

## 2024-04-16 NOTE — Progress Notes (Signed)
   04/16/24 2116  BiPAP/CPAP/SIPAP  BiPAP/CPAP/SIPAP Pt Type Adult  Reason BIPAP/CPAP not in use Non-compliant (Patient refused CPAP qhs.  She states that she is woken up too much throughout the night for her to tolerate wearing it while in the hospital.  Patient encouraged to contact RT should she change her mind.)

## 2024-04-17 NOTE — Discharge Summary (Signed)
 Patient ID: [name] [id number] y.o. Sep 13, 1965  04/15/2024  Discharge date and time: No discharge date for patient encounter.  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  Diet was advanced as tolerated.  By postop day 2, she was tolerating a liquid diet and pain was controlled with oral medications.  she was urinating without difficulty and ambulating without assistance.  Patient was felt to be in stable condition for discharge to home.   Consults: None  Significant Diagnostic Studies: labs: cbc, bmet.  Esophagram   Treatments: IV hydration, analgesia: acetaminophen , and surgery: robotic hiatal hernia repair  Disposition: Home  Bernarda JAYSON Ned, MD  Colorectal and General Surgery [facility]

## 2024-04-17 NOTE — Plan of Care (Signed)
 ?  Problem: Elimination: ?Goal: Will not experience complications related to urinary retention ?Outcome: Progressing ?  ?

## 2024-04-17 NOTE — Progress Notes (Signed)
 Patient given a medical records release form so she can obtain her records for the [facility]. Pt aware admissions may not be available until Monday

## 2024-04-17 NOTE — Progress Notes (Signed)
 Assessment unchanged. Pt verbalized understanding of dc instructions given by Ciera, RN-Flow Nurse. Understands diet's slow progression over next few weeks and medications to resume. Discharged via wc to front entrance accompanied by NT.

## 2024-04-17 NOTE — TOC Initial Note (Signed)
 Transition of Care [facility]) - Initial/Assessment Note    Patient Details  Name: [name] MRN: [id number] Date of Birth: 1966/02/10  Transition of [facility]) CM/SW Contact:    Sonda Manuella Quill, RN Phone Number: 04/17/2024, 9:24 AM  Clinical Narrative:                 [name] w/ pt in room; pt says she shares a home w/ her son; she plans to return at d/c; she identified POC [name] [phone]); family will provide transportation; pt verified insurance/PCP; she denied SDOH risks; pt has cpap; she does not have HH services or home oxygen; no TOC needs.  Expected Discharge Plan: Home/Self Care Barriers to Discharge: No Barriers Identified   Patient Goals and CMS Choice Patient states their goals for this hospitalization and ongoing recovery are:: home          Expected Discharge Plan and Services   Discharge Planning Services: CM Consult   Living arrangements for the past 2 months: Single Family Home Expected Discharge Date: 04/17/24               DME Arranged: N/A DME Agency: NA       HH Arranged: NA HH Agency: NA        Prior Living Arrangements/Services Living arrangements for the past 2 months: Single Family Home Lives with:: Adult Children Patient language and need for interpreter reviewed:: Yes Do you feel safe going back to the place where you live?: Yes      Need for Family Participation in Patient Care: Yes (Comment) Care giver support system in place?: Yes (comment) Current home services: DME (cpap) Criminal Activity/Legal Involvement Pertinent to Current Situation/Hospitalization: No - Comment as needed  Activities of Daily Living   ADL Screening (condition at time of admission) Independently performs ADLs?: Yes (appropriate for developmental age) Is the patient deaf or have difficulty hearing?: No Does the patient have difficulty seeing, even when wearing glasses/contacts?: No Does the patient have difficulty concentrating,  remembering, or making decisions?: No  Permission Sought/Granted Permission sought to share information with : Case Manager Permission granted to share information with : Yes, Verbal Permission Granted  Share Information with NAME: Case Manager     Permission granted to share info w Relationship: [name] [phone])     Emotional Assessment Appearance:: Appears stated age Attitude/Demeanor/Rapport: Gracious Affect (typically observed): Accepting Orientation: : Oriented to Self, Oriented to Place, Oriented to  Time, Oriented to Situation Alcohol  / Substance Use: Not Applicable Psych Involvement: No (comment)  Admission diagnosis:  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 of pelvic endometrisosis 12/30/2011   Hypertension 12/30/2011   Hyperlipidemia 12/30/2011   Family history of colon cancer 12/30/2011   PCP:  [facility]:   [id number] [address] [place], [place] - [address] [place] [address] Phone: [phone] Fax: [phone]     Social Drivers of Health (SDOH) Social History: SDOH Screenings   Food Insecurity: No Food Insecurity (04/17/2024)  Housing: Low Risk  (04/17/2024)  Transportation Needs: No Transportation Needs (04/17/2024)  Utilities: Not At Risk (04/17/2024)  Social Connections: Unknown (01/11/2022)   Received from [facility]  Tobacco Use: Low Risk  (04/15/2024)   SDOH  Interventions: Food Insecurity Interventions: Intervention Not Indicated, Inpatient TOC Housing Interventions: Intervention Not Indicated, Inpatient TOC Transportation Interventions: Intervention Not Indicated, Inpatient TOC Utilities Interventions: Intervention Not Indicated, Inpatient TOC   Readmission Risk Interventions    04/17/2024    9:19 AM  Readmission Risk Prevention Plan  Transportation Screening Complete  PCP or Specialist Appt within 5-7 Days Complete  Home Care Screening Complete  Medication Review (RN CM) Complete

## 2024-04-17 NOTE — Plan of Care (Signed)
  Problem: Education: Goal: Knowledge of General Education information will improve Description: Including pain rating scale, medication(s)/side effects and non-pharmacologic comfort measures Outcome: Adequate for Discharge   Problem: Health Behavior/Discharge Planning: Goal: Ability to manage health-related needs will improve Outcome: Adequate for Discharge   Problem: Clinical Measurements: Goal: Ability to maintain clinical measurements within normal limits will improve Outcome: Adequate for Discharge Goal: Will remain free from infection Outcome: Adequate for Discharge Goal: Diagnostic test results will improve Outcome: Adequate for Discharge Goal: Respiratory complications will improve Outcome: Adequate for Discharge Goal: Cardiovascular complication will be avoided Outcome: Adequate for Discharge   Problem: Activity: Goal: Risk for activity intolerance will decrease Outcome: Adequate for Discharge   Problem: Nutrition: Goal: Adequate nutrition will be maintained Outcome: Adequate for Discharge   Problem: Coping: Goal: Level of anxiety will decrease Outcome: Adequate for Discharge   Problem: Elimination: Goal: Will not experience complications related to bowel motility Outcome: Adequate for Discharge Goal: Will not experience complications related to urinary retention Outcome: Adequate for Discharge   Problem: Pain Managment: Goal: General experience of comfort will improve and/or be controlled Outcome: Adequate for Discharge   Problem: Safety: Goal: Ability to remain free from injury will improve Outcome: Adequate for Discharge   Problem: Skin Integrity: Goal: Risk for impaired skin integrity will decrease Outcome: Adequate for Discharge   Problem: Food- and Nutrition-Related Knowledge Deficit (NB-1.1) Goal: Nutrition education Description: Formal process to instruct or train a patient/client in a skill or to impart knowledge to help patients/clients  voluntarily manage or modify food choices and eating behavior to maintain or improve health. Outcome: Adequate for Discharge
